# Patient Record
Sex: Female | Born: 1995 | Race: Black or African American | Hispanic: No | Marital: Married
Health system: Midwestern US, Community
[De-identification: ages and names within clinical notes are randomized; demographics above are authoritative.]

## PROBLEM LIST (undated history)

## (undated) DIAGNOSIS — D649 Anemia, unspecified: Secondary | ICD-10-CM

## (undated) DIAGNOSIS — E119 Type 2 diabetes mellitus without complications: Secondary | ICD-10-CM

## (undated) HISTORY — DX: Morbid (severe) obesity due to excess calories: E66.01

## (undated) HISTORY — PX: NO PAST SURGERIES: SHX2092

---

## 2015-11-13 DIAGNOSIS — O365930 Maternal care for other known or suspected poor fetal growth, third trimester, not applicable or unspecified: Secondary | ICD-10-CM

## 2015-11-13 NOTE — Other (Signed)
Patient Demographics  ??   ?? Patient Name HAR Sex DOB Address Phone ??   ?? Taylor Romero, Taylor Romero [phone] Female October 31, 1995 [address]  [place] [address] [phone] (Mobile) ??   ??   ?? CSN: ??   ?? [id number] ??   ??   ?? Admit Date: Admit Time Room Bed ??   ?? Nov 13, 2015 10:04 PM 2007 [15162] 01 [[id number]] ??   ??   ?? Attending Providers  ??   ?? Provider Pager From To ??   ?? Alvester Chou, MD  11/13/15       ADM 7/26 DEL 7/27    Birth History   Birth Information   Birth Length: 45.7 cm   Birth Weight: 2.781 kg   Gestational Age: 96 4/7 weeks   Delivery Method: Vaginal, Spontaneous Delivery   Duration of Labor: 1st: 11h / 2nd: 28m    APGARs   1 Minute: 8   5 Minute: 8

## 2015-11-14 ENCOUNTER — Inpatient Hospital Stay
Admit: 2015-11-14 | Discharge: 2015-11-16 | Disposition: A | Discharge status: Home / Self Care | Payer: MEDICAID | Attending: Obstetrics & Gynecology | Admitting: Obstetrics & Gynecology

## 2015-11-14 LAB — TYPE AND SCREEN
ABO/Rh: O POS
Antibody Screen: NEGATIVE

## 2015-11-14 LAB — HIV 1/2 RAPID SCREEN
HIV RAPID SCR 1/2,RHIV12: NEGATIVE
HIV-1/2 rapid screen: NEGATIVE

## 2015-11-14 LAB — TYPE & SCREEN
ABO/Rh(D): O POS
Antibody screen: NEGATIVE

## 2015-11-14 LAB — METABOLIC PANEL, COMPREHENSIVE
A-G Ratio: 0.5 — ABNORMAL LOW (ref 0.8–1.7)
ALT (SGPT): 13 U/L (ref 13–56)
AST (SGOT): 16 U/L (ref 15–37)
Albumin: 2.3 g/dL — ABNORMAL LOW (ref 3.4–5.0)
Alk. phosphatase: 94 U/L (ref 45–117)
Anion gap: 12 mmol/L (ref 3.0–18)
BUN/Creatinine ratio: 9 — ABNORMAL LOW (ref 12–20)
BUN: 7 MG/DL (ref 7.0–18)
Bilirubin, total: 0.4 MG/DL (ref 0.2–1.0)
CO2: 19 mmol/L — ABNORMAL LOW (ref 21–32)
Calcium: 8.9 MG/DL (ref 8.5–10.1)
Chloride: 102 mmol/L (ref 100–108)
Creatinine: 0.82 MG/DL (ref 0.6–1.3)
GFR est AA: >60 mL/min/{1.73_m2} (ref >60)
GFR est non-AA: >60 mL/min/{1.73_m2} (ref >60)
Globulin: 4.2 g/dL — ABNORMAL HIGH (ref 2.0–4.0)
Glucose: 115 mg/dL — ABNORMAL HIGH (ref 74–99)
Potassium: 4 mmol/L (ref 3.5–5.5)
Protein, total: 6.5 g/dL (ref 6.4–8.2)
Sodium: 133 mmol/L — ABNORMAL LOW (ref 136–145)

## 2015-11-14 LAB — CBC WITH AUTOMATED DIFF
ABS. BASOPHILS: 0 10*3/uL (ref 0.0–0.06)
ABS. EOSINOPHILS: 0 10*3/uL (ref 0.0–0.4)
ABS. LYMPHOCYTES: 0.9 10*3/uL (ref 0.9–3.6)
ABS. MONOCYTES: 0.4 10*3/uL (ref 0.05–1.2)
ABS. NEUTROPHILS: 8.9 10*3/uL — ABNORMAL HIGH (ref 1.8–8.0)
BASOPHILS: 0 % (ref 0–2)
EOSINOPHILS: 0 % (ref 0–5)
HCT: 28.3 % — ABNORMAL LOW (ref 35.0–45.0)
HGB: 8.9 g/dL — ABNORMAL LOW (ref 12.0–16.0)
LYMPHOCYTES: 9 % — ABNORMAL LOW (ref 21–52)
MCH: 19.9 PG — ABNORMAL LOW (ref 24.0–34.0)
MCHC: 31.4 g/dL (ref 31.0–37.0)
MCV: 63.2 FL — ABNORMAL LOW (ref 74.0–97.0)
MONOCYTES: 4 % (ref 3–10)
NEUTROPHILS: 87 % — ABNORMAL HIGH (ref 40–73)
PLATELET: 244 10*3/uL (ref 135–420)
RBC: 4.48 M/uL (ref 4.20–5.30)
RDW: 19.1 % — ABNORMAL HIGH (ref 11.6–14.5)
WBC: 10.2 10*3/uL (ref 4.6–13.2)

## 2015-11-14 LAB — HEMOGLOBIN A1C WITH EAG
Est. average glucose: 146 mg/dL
Hemoglobin A1c: 6.7 % — ABNORMAL HIGH (ref 4.5–5.6)

## 2015-11-14 LAB — POC URINE MACROSCOPIC
Bilirubin (POC): NEGATIVE
Blood (POC): NEGATIVE
Glucose, urine (POC): NEGATIVE mg/dL
Leukocyte esterase (POC): NEGATIVE
Nitrite (POC): NEGATIVE
Protein (POC): 30 mg/dL — AB
Spec. gravity (POC): 1.025 — ABNORMAL HIGH (ref 1.001–1.023)
Urobilinogen (POC): 0.2 EU/dL (ref 0.2–1.0)
pH, urine  (POC): 7 (ref 5.0–9.0)

## 2015-11-14 LAB — GLUCOSE, POC: Glucose (POC): 144 mg/dL — ABNORMAL HIGH (ref 70–110)

## 2015-11-14 LAB — HEP B SURFACE AG
Hep B surface Ag Interp.: NEGATIVE
Hepatitis B surface Ag: <0.1 Index (ref <1.00)

## 2015-11-14 MED ORDER — LACTATED RINGERS BOLUS IV
INTRAVENOUS | Status: DC | PRN
Start: 2015-11-14 — End: 2015-11-14

## 2015-11-14 MED ORDER — ACETAMINOPHEN 325 MG TABLET
325 mg | ORAL | Status: DC | PRN
Start: 2015-11-14 — End: 2015-11-17

## 2015-11-14 MED ORDER — SODIUM CHLORIDE 0.9 % IJ SYRG
INTRAMUSCULAR | Status: DC | PRN
Start: 2015-11-14 — End: 2015-11-14

## 2015-11-14 MED ORDER — METHYLERGONOVINE MALEATE 0.2 MG/ML IJ SOLN
0.2 mg/mL (1 mL) | INTRAMUSCULAR | Status: DC | PRN
Start: 2015-11-14 — End: 2015-11-14

## 2015-11-14 MED ORDER — LACTATED RINGERS IV
INTRAVENOUS | Status: DC
Start: 2015-11-14 — End: 2015-11-14
  Administered 2015-11-14 (×2): via INTRAVENOUS

## 2015-11-14 MED ORDER — PENICILLIN G POT 2.5 MILLION UNITS IN 50 ML 0.9% NACL
2.5 MU | INTRAVENOUS | Status: DC
Start: 2015-11-14 — End: 2015-11-14
  Administered 2015-11-14: 11:00:00 via INTRAVENOUS

## 2015-11-14 MED ORDER — FENTANYL-ROPIVACAINE IN NS (PF) 2 MCG/ML-0.1 % EPIDURAL
EPIDURAL | Status: AC
Start: 2015-11-14 — End: 2015-11-14
  Administered 2015-11-14: 10:00:00 via EPIDURAL

## 2015-11-14 MED ORDER — IBUPROFEN 400 MG TAB
400 mg | Freq: Three times a day (TID) | ORAL | Status: DC | PRN
Start: 2015-11-14 — End: 2015-11-17
  Administered 2015-11-15: 22:00:00 via ORAL

## 2015-11-14 MED ORDER — BUTORPHANOL TARTRATE 2 MG/ML IJ SOLN
2 mg/mL | INTRAMUSCULAR | Status: DC | PRN
Start: 2015-11-14 — End: 2015-11-14
  Administered 2015-11-14: 06:00:00 via INTRAVENOUS

## 2015-11-14 MED ORDER — HYDROMORPHONE (PF) 1 MG/ML IJ SOLN
1 mg/mL | INTRAMUSCULAR | Status: DC | PRN
Start: 2015-11-14 — End: 2015-11-14

## 2015-11-14 MED ORDER — LACTATED RINGERS BOLUS IV
Freq: Once | INTRAVENOUS | Status: DC
Start: 2015-11-14 — End: 2015-11-14

## 2015-11-14 MED ORDER — FENTANYL CITRATE (PF) 50 MCG/ML IJ SOLN
50 mcg/mL | INTRAMUSCULAR | Status: AC
Start: 2015-11-14 — End: 2015-11-14

## 2015-11-14 MED ORDER — FENTANYL CITRATE (PF) 50 MCG/ML IJ SOLN
50 mcg/mL | INTRAMUSCULAR | Status: DC | PRN
Start: 2015-11-14 — End: 2015-11-15
  Administered 2015-11-14: 09:00:00 via EPIDURAL

## 2015-11-14 MED ORDER — PENICILLIN G POTASSIUM 5,000,000 UNIT IJ SOLR
5 million unit | Freq: Once | INTRAMUSCULAR | Status: AC
Start: 2015-11-14 — End: 2015-11-14
  Administered 2015-11-14: 07:00:00 via INTRAVENOUS

## 2015-11-14 MED ORDER — OXYCODONE-ACETAMINOPHEN 5 MG-325 MG TAB
5-325 mg | ORAL | Status: DC | PRN
Start: 2015-11-14 — End: 2015-11-17
  Administered 2015-11-15: 22:00:00 via ORAL

## 2015-11-14 MED ORDER — OXYTOCIN 20 UNITS/1000 ML IN LACTATED RINGERS IV
20 unit/1,000 mL | Freq: Once | INTRAVENOUS | Status: AC
Start: 2015-11-14 — End: 2015-11-14
  Administered 2015-11-14: 13:00:00 via INTRAVENOUS

## 2015-11-14 MED ORDER — SODIUM CHLORIDE 0.9 % IJ SYRG
Freq: Three times a day (TID) | INTRAMUSCULAR | Status: DC
Start: 2015-11-14 — End: 2015-11-14

## 2015-11-14 MED ORDER — NALBUPHINE 10 MG/ML INJECTION
10 mg/mL | INTRAMUSCULAR | Status: DC | PRN
Start: 2015-11-14 — End: 2015-11-14

## 2015-11-14 MED ORDER — FENTANYL-ROPIVACAINE IN NS (PF) 2 MCG/ML-0.1 % EPIDURAL
EPIDURAL | Status: DC
Start: 2015-11-14 — End: 2015-11-14
  Administered 2015-11-14 (×3): via EPIDURAL

## 2015-11-14 MED ORDER — LIDOCAINE HCL 1 % (10 MG/ML) IJ SOLN
10 mg/mL (1 %) | INTRAMUSCULAR | Status: AC
Start: 2015-11-14 — End: 2015-11-14

## 2015-11-14 MED ORDER — FENTANYL CITRATE (PF) 50 MCG/ML IJ SOLN
50 mcg/mL | Freq: Once | INTRAMUSCULAR | Status: DC
Start: 2015-11-14 — End: 2015-11-14

## 2015-11-14 MED ORDER — MINERAL OIL ORAL
ORAL | Status: DC | PRN
Start: 2015-11-14 — End: 2015-11-14

## 2015-11-14 MED ORDER — OXYTOCIN 20 UNITS/1000 ML IN LACTATED RINGERS IV
20 unit/1,000 mL | INTRAVENOUS | Status: DC
Start: 2015-11-14 — End: 2015-11-14
  Administered 2015-11-14: 14:00:00 via INTRAVENOUS

## 2015-11-14 MED ORDER — TERBUTALINE 1 MG/ML SUB-Q
1 mg/mL | SUBCUTANEOUS | Status: DC | PRN
Start: 2015-11-14 — End: 2015-11-14

## 2015-11-14 MED ORDER — LIDOCAINE (PF) 10 MG/ML (1 %) IJ SOLN
10 mg/mL (1 %) | INTRAMUSCULAR | Status: DC | PRN
Start: 2015-11-14 — End: 2015-11-14

## 2015-11-14 MED FILL — BD POSIFLUSH NORMAL SALINE 0.9 % INJECTION SYRINGE: INTRAMUSCULAR | Qty: 10

## 2015-11-14 MED FILL — PENICILLIN G POT 2.5 MILLION UNITS IN 50 ML 0.9% NACL: 2.5 MU | INTRAVENOUS | Qty: 50

## 2015-11-14 MED FILL — OXYTOCIN 20 UNITS/1000 ML IN LACTATED RINGERS IV: 20 unit/1,000 mL | INTRAVENOUS | Qty: 1000

## 2015-11-14 MED FILL — LACTATED RINGERS IV: INTRAVENOUS | Qty: 1000

## 2015-11-14 MED FILL — FENTANYL CITRATE (PF) 50 MCG/ML IJ SOLN: 50 mcg/mL | INTRAMUSCULAR | Qty: 2

## 2015-11-14 MED FILL — PFIZERPEN-G 5 MILLION UNIT SOLUTION FOR INJECTION: 5 million unit | INTRAMUSCULAR | Qty: 5000000

## 2015-11-14 MED FILL — FENTANYL-ROPIVACAINE IN NS (PF) 2 MCG/ML-0.1 % EPIDURAL: EPIDURAL | Qty: 100

## 2015-11-14 MED FILL — MINERAL OIL ORAL: ORAL | Qty: 30

## 2015-11-14 MED FILL — LIDOCAINE HCL 1 % (10 MG/ML) IJ SOLN: 10 mg/mL (1 %) | INTRAMUSCULAR | Qty: 20

## 2015-11-14 MED FILL — BUTORPHANOL TARTRATE 2 MG/ML IJ SOLN: 2 mg/mL | INTRAMUSCULAR | Qty: 1

## 2015-11-14 NOTE — Op Note (Signed)
Patient: Taylor Romero                   Delivery Summary    Circumcision:   NA-female    This patient has no babies on file.    Cord pH:  none    Episiotomy:   Laceration(s):     Estimated Blood Loss (ml):     Labor Events  Method:      Augmentation:   Cervical Ripening:        Induction - no    Induction Indication - N/A    Induction Method - N/A    Complications - none    NICU Admit - no    HTN Disorders - none    Diabetes - N/A    Operative Vaginal Delivery - none    Additional Delivery Comments - No prenatal care

## 2015-11-14 NOTE — Anesthesia Procedure Notes (Signed)
Epidural Block    Start time: 11/14/2015 5:05 AM  End time: 11/14/2015 5:26 AM  Performed by: Krystal Clark  Authorized by: Krystal Clark     Pre-Procedure  Indication: labor epidural    Preanesthetic Checklist: patient identified, risks and benefits discussed, anesthesia consent, site marked, patient being monitored, timeout performed and anesthesia consent    Timeout Time: 05:05        Epidural:   Patient position:  Seated  Prep region:  Lumbar  Prep: Chlorhexidine    Location:  L3-4    Needle and Epidural Catheter:   Needle Type:  Tuohy  Needle Gauge:  17 G  Injection Technique:  Loss of resistance using air  Attempts:  1  Catheter Size:  19 G  Catheter at Skin Depth (cm):  12  Depth in Epidural Space (cm):  9  Events: no blood with aspiration, no cerebrospinal fluid with aspiration, no paresthesia and negative aspiration test    Test Dose:  Lidocaine 1.5% w/ epi and negative    Assessment:   Catheter Secured:  Tegaderm and tape  Insertion:  Uncomplicated  Patient tolerance:  Patient tolerated the procedure well with no immediate complications

## 2015-11-14 NOTE — Progress Notes (Signed)
Bedside and Verbal shift change report given to A. Habura RN (oncoming nurse) by A. Atianna Haidar RN (offgoing nurse). Report included the following information SBAR, Kardex and Recent Results.

## 2015-11-14 NOTE — H&P (Signed)
Obstetrical History and Physical    Subjective:     Date of Admission: 11/14/2015    Patient is a 20 y.o. G1 P0000  female admitted at approximately 98 WEGA with no routine prenatal care other than "a few [place] at [facility]".  Pt states no VB or LOF and states she was never informed of any abnormalities regarding her [place].    For Obstetric history, see prenantal.    For Review of Systems, see prenatal    Past Medical History:   Diagnosis Date   ??? Psychiatric problem     depression      No past surgical history on file.   Prior to Admission medications    Medication Sig Start Date End Date Taking? Authorizing Provider   PNV NO.95/FERROUS FUM/FOLIC AC (PRENATAL PO) Take  by mouth.   Yes Historical Provider     No Known Allergies   Social History   Substance Use Topics   ??? Smoking status: Never Smoker   ??? Smokeless tobacco: Never Used   ??? Alcohol use No      No family history on file.       Objective:     Blood pressure 149/74, pulse (!) 116, temperature 98.1 ??F (36.7 ??C), resp. rate 20, height  (1.6 m), weight 122.5 kg (270 lb), SpO2 100 %, currently breastfeeding. Temp (24hrs), Avg:98.2 ??F (36.8 ??C), Min:98.1 ??F (36.7 ??C), Max:98.3 ??F (36.8 ??C)           07/25 1901 - 07/27 0700  In: -   Out: 130 [Urine:130]    HEENT: No pallor, no jaundice, Thyroid and throat normal  RESPIRATORY: Clear to A & P  CVS: pulse reg, HS normal  ABDOMEN: Gravid. Vertex. EFW=7lb +-1lb. No abnormal tenderness.   Pelvic: Cervix 10,   Effaced: 100%  Station:  +1  Data Review:   Recent Results (from the past 24 hour(s))   POC URINE MACROSCOPIC    Collection Time: 11/13/15 10:27 PM   Result Value Ref Range    Color YELLOW      Appearance CLEAR      Spec. gravity (POC) 1.025 (H) 1.001 - 1.023      pH, urine  (POC) 7.0 5.0 - 9.0      Protein (POC) 30 (A) NEG mg/dL    Glucose, urine (POC) NEGATIVE  NEG mg/dL    Ketones (POC) TRACE (A) NEG mg/dL    Bilirubin (POC) NEGATIVE  NEG      Blood (POC) NEGATIVE  NEG       Urobilinogen (POC) 0.2 0.2 - 1.0 EU/dL    Nitrite (POC) NEGATIVE  NEG      Leukocyte esterase (POC) NEGATIVE  NEG      Performed by Liborio Nixon    CBC WITH AUTOMATED DIFF    Collection Time: 11/14/15  2:14 AM   Result Value Ref Range    WBC 10.2 4.6 - 13.2 K/uL    RBC 4.48 4.20 - 5.30 M/uL    HGB 8.9 (L) 12.0 - 16.0 g/dL    HCT 42.7 (L) 06.2 - 45.0 %    MCV 63.2 (L) 74.0 - 97.0 FL    MCH 19.9 (L) 24.0 - 34.0 PG    MCHC 31.4 31.0 - 37.0 g/dL    RDW 37.6 (H) 28.3 - 14.5 %    PLATELET 244 135 - 420 K/uL    NEUTROPHILS 87 (H) 40 - 73 %    LYMPHOCYTES 9 (L) 21 -  52 %    MONOCYTES 4 3 - 10 %    EOSINOPHILS 0 0 - 5 %    BASOPHILS 0 0 - 2 %    ABS. NEUTROPHILS 8.9 (H) 1.8 - 8.0 K/UL    ABS. LYMPHOCYTES 0.9 0.9 - 3.6 K/UL    ABS. MONOCYTES 0.4 0.05 - 1.2 K/UL    ABS. EOSINOPHILS 0.0 0.0 - 0.4 K/UL    ABS. BASOPHILS 0.0 0.0 - 0.06 K/UL    DF AUTOMATED     TYPE & SCREEN    Collection Time: 11/14/15  2:14 AM   Result Value Ref Range    Crossmatch Expiration 11/17/2015     ABO/Rh(D) [place] POSITIVE     Antibody screen NEG    HIV 1/2 RAPID SCREEN    Collection Time: 11/14/15  2:14 AM   Result Value Ref Range    HIV-1/2 rapid screen NEGATIVE      GLUCOSE, POC    Collection Time: 11/14/15  8:20 AM   Result Value Ref Range    Glucose (POC) 144 (H) 70 - 110 mg/dL     Monitor:  Reactivity:present Variability:present Baseline:within normal limits    Assessment:     Active Problems:    IUP (intrauterine pregnancy), incidental (11/14/2015)        Plan:       Check labs:  CBC  Check  Prenatal:    Disposition:     Type of admit:In-Patient    I saw and examined patient.    Signed By: Alvester Chou, MD                         November 14, 2015

## 2015-11-14 NOTE — Progress Notes (Signed)
Patient was visited by Spiritual Care volunteer Judi Allison. Volunteer conducted a Spiritual Care Screening and reported no needs to this chaplain. Baby Blessing Card and Spiritual Care literature were provided. Chaplains will continue to follow and will provide pastoral care as needed or requested.    Chaplain T. Wes Moore  Chaplain Intern  [phone] - Office

## 2015-11-14 NOTE — Lactation Note (Addendum)
Infant had to be supplemented for low blood sugar. Per mom, had a hard time getting infant to latch. Will page for feeds. Breastfeeding basics and supply and demand discussed.    1315 Infant latched and nursing well with nipple shield at 1325    1745 Set up with pump as infant now moved to NICU for monitoring of blood sugars.

## 2015-11-14 NOTE — Anesthesia Procedure Notes (Signed)
Epidural Block    Start time: 11/14/2015 5:05 AM  End time: 11/14/2015 5:26 AM  Performed by: Lorisa Scheid  Authorized by: Talea Manges     Pre-Procedure  Indication: labor epidural    Preanesthetic Checklist: patient identified, risks and benefits discussed, anesthesia consent, site marked, patient being monitored, timeout performed and anesthesia consent    Timeout Time: 05:05        Epidural:   Patient position:  Seated  Prep region:  Lumbar  Prep: Chlorhexidine    Location:  L3-4    Needle and Epidural Catheter:   Needle Type:  Tuohy  Needle Gauge:  17 G  Injection Technique:  Loss of resistance using air  Attempts:  1  Catheter Size:  19 G  Catheter at Skin Depth (cm):  12  Depth in Epidural Space (cm):  9  Events: no blood with aspiration, no cerebrospinal fluid with aspiration, no paresthesia and negative aspiration test    Test Dose:  Lidocaine 1.5% w/ epi and negative    Assessment:   Catheter Secured:  Tegaderm and tape  Insertion:  Uncomplicated  Patient tolerance:  Patient tolerated the procedure well with no immediate complications

## 2015-11-14 NOTE — Progress Notes (Addendum)
0720: Bedside and Verbal shift change report given to E Sauder, RN (oncoming nurse) by Carmin Muskrat, RN (offgoing nurse). Report included the following information SBAR, Kardex, Intake/Output, MAR and Recent Results. Care assumed. Patient in semi fowlers.  Has some "vaginal soreness" but states the epidural has helped with her back pain and contraction pain    0746: amion page to dr. Sindy Guadeloupe. Anterior lip with bulging bag. Patient feeling some pressure    0759: informed MD of BPs.while on left side. Will order PIH labs    0820: bedside fingerstick blood sugar 144. MD aware    0845: SVD of viable female infant    49: placenta    1030: OOB to void with assistance. Epidural pulled. Able to ambulated and void.  Pads and gown changed    1100: oob to void. Able to void and abmbulate without assistance    1115: TRANSFER - OUT REPORT:    Verbal report given to A Cory(name) on Andres Labrum  being transferred to pp rm 251 (unit) for routine progression of care       Report consisted of patient???s Situation, Background, Assessment and   Recommendations(SBAR).     Information from the following report(s) SBAR, Kardex, Intake/Output, MAR and Recent Results was reviewed with the receiving nurse.    Lines:   Peripheral IV 11/14/15 Left Hand (Active)        Opportunity for questions and clarification was provided.      Patient transported with:   Registered Nurse        1240: dr. Sindy Guadeloupe informed of A1C result

## 2015-11-14 NOTE — Progress Notes (Addendum)
2104 pt arrived here on unit via wheelchair with c/o ctxs all day, pt states she does not have a dr, pt states she had an ultrasound through [facility]  2230 cervix 2cm  0000 cervix now 3cm, pt c/o leaking, green mucus noted when checking  0025 spoke with Dr Sindy Guadeloupe via phone about pt status, cervix 3cm bag of water felt, thick green mucus discharge noted, nitrazine neg, orders to discharge to home  0045 discussed poc with pt, pt states she is feeling more uncomfortable now and would like to stay longer  0105 cervix 4cm now, green mucus discharge noted  0120 spoke with Dr Sindy Guadeloupe via phone about pt status, cervix, green discharge, fetal strip, orders to admit pt  0340 spoke with Dr Sindy Guadeloupe via phone about gbs unknown, orders to start antibiotics  0535 spoke with Dr Kerry Hough for pt epidural  0645 Dr Sindy Guadeloupe called in via phone, update given on pt status, strip being viewed by MD from home,

## 2015-11-14 NOTE — Op Note (Signed)
Patient: Taegen Sparkman                   Delivery Summary    Circumcision:   NA-female    This patient has no babies on file.    Cord pH:  none    Episiotomy:   Laceration(s):     Estimated Blood Loss (ml):     Labor Events  Method:      Augmentation:   Cervical Ripening:        Induction - no    Induction Indication - N/A    Induction Method - N/A    Complications - none    NICU Admit - no    HTN Disorders - none    Diabetes - N/A    Operative Vaginal Delivery - none    Additional Delivery Comments - No prenatal care

## 2015-11-14 NOTE — Progress Notes (Addendum)
0202:  Pt admitted.  Pt ambulated to Room 7 with husband at bedside.      0214:  Pt c/o pain 06/10 with contractions.    0225:  PRN stadol adminsitered    0421:  Pt request epidural.  Bolus started    0446:  Anesthesia paged    0505: Dr. Kerry Hough @ bedside discussing epidural. Risk and benefits discussed with pt.Pt wished to proceed. Confirmation of signed consent form. Pt positioned at side of bed for procedure. ??Time out performed.    0518  Catheter placed.   ??  0518: Test dose administered. ??    0519:  Loading dose administered. Fentanyl vial given to Kerry Hough to be administered through epidural.    0522:  Pt lying back in bed. TOCO and [place] adjusted. ??PCA pump started.

## 2015-11-14 NOTE — Anesthesia Pre-Procedure Evaluation (Signed)
Anesthetic History   No history of anesthetic complications            Review of Systems / Medical History  Patient summary reviewed, nursing notes reviewed and pertinent labs reviewed    Pulmonary  Within defined limits                 Neuro/Psych   Within defined limits           Cardiovascular  Within defined limits                     GI/Hepatic/Renal  Within defined limits              Endo/Other        Morbid obesity     Other Findings              Physical Exam    Airway  Mallampati: II  TM Distance: 4 - 6 cm  Neck ROM: normal range of motion   Mouth opening: Normal     Cardiovascular               Dental  No notable dental hx       Pulmonary                 Abdominal         Other Findings            Anesthetic Plan    ASA: 3  Anesthesia type: epidural            Anesthetic plan and risks discussed with: Patient and Family      R/B discussed including ha backache bleeding infection nerve damage paralysis.

## 2015-11-15 LAB — RPR
RPR: NONREACTIVE
RPR: NONREACTIVE

## 2015-11-15 LAB — HEMATOCRIT: HCT: 25.7 % — ABNORMAL LOW (ref 35.0–45.0)

## 2015-11-15 LAB — HEMOGLOBIN: HGB: 7.9 g/dL — ABNORMAL LOW (ref 12.0–16.0)

## 2015-11-15 LAB — RUBELLA AB, IGG: Rubella IgG, QL: IMMUNE

## 2015-11-15 MED ORDER — BENZOCAINE-MENTHOL 20 %-0.5 % TOPICAL AEROSOL
CUTANEOUS | Status: DC | PRN
Start: 2015-11-15 — End: 2015-11-17

## 2015-11-15 MED ORDER — ZOLPIDEM 5 MG TAB
5 mg | Freq: Every evening | ORAL | Status: DC | PRN
Start: 2015-11-15 — End: 2015-11-17

## 2015-11-15 MED ORDER — GLYCERIN-WITCH HAZEL 12.5 %-50 % TOPICAL PADS
CUTANEOUS | Status: DC | PRN
Start: 2015-11-15 — End: 2015-11-17

## 2015-11-15 MED ORDER — MEASLES,MUMPS&RUBELLA VACCIN(PF) 1,000-12,500 TCID50/0.5 ML SUB-Q SUSP
1000-12500 TCID50/0.5 mL | SUBCUTANEOUS | Status: DC | PRN
Start: 2015-11-15 — End: 2015-11-16

## 2015-11-15 MED ORDER — PROMETHAZINE 25 MG/ML INJECTION
25 mg/mL | INTRAMUSCULAR | Status: DC | PRN
Start: 2015-11-15 — End: 2015-11-17

## 2015-11-15 MED ORDER — RHO D IMMUNE GLOBULIN 300 MCG IM SYRG
1500 unit (300 mcg) | INTRAMUSCULAR | Status: DC | PRN
Start: 2015-11-15 — End: 2015-11-16

## 2015-11-15 MED ORDER — SENNOSIDES-DOCUSATE SODIUM 8.6 MG-50 MG TAB
Freq: Two times a day (BID) | ORAL | Status: DC | PRN
Start: 2015-11-15 — End: 2015-11-17

## 2015-11-15 MED ORDER — MODIFIED LANOLIN CREAM
CUTANEOUS | Status: DC | PRN
Start: 2015-11-15 — End: 2015-11-17

## 2015-11-15 MED FILL — OXYCODONE-ACETAMINOPHEN 5 MG-325 MG TAB: 5-325 mg | ORAL | Qty: 2

## 2015-11-15 MED FILL — DERMOPLAST (WITH MENTHOL) 20 %-0.5 % TOPICAL AEROSOL: CUTANEOUS | Qty: 56

## 2015-11-15 MED FILL — MODIFIED LANOLIN CREAM: CUTANEOUS | Qty: 7

## 2015-11-15 MED FILL — GLYCERIN-WITCH HAZEL 12.5 %-50 % TOPICAL PADS: CUTANEOUS | Qty: 1

## 2015-11-15 MED FILL — IBUPROFEN 400 MG TAB: 400 mg | ORAL | Qty: 2

## 2015-11-15 NOTE — Anesthesia Post-Procedure Evaluation (Signed)
11/15/2015  1:21 PM    Laboring Epidural Follow-up Note     Referring physician: Alvester Chou, MD   Patient status post vaginal delivery with labor epidural    Visit Vitals   ??? BP 114/53 (BP 1 Location: Right arm, BP Patient Position: At rest)   ??? Pulse 100   ??? Temp 36.8 ??C (98.2 ??F)   ??? Resp 14   ??? Ht 5\' 3"  (1.6 m)   ??? Wt 122.5 kg (270 lb)   ??? SpO2 98%   ??? Breastfeeding Yes   ??? BMI 47.83 kg/m2       Epidural removed by L&D staff  No sedation, pruritis noted.  Adequate analgesia.  No obvious anesthesia complications          Etter Sjogren, CRNA

## 2015-11-15 NOTE — Progress Notes (Signed)
OB/GYN Progress Note    PPD # 1  Assessment:     S/p SVD  NPNC  Infant in NICU secondary to SGA and hypoglycemia  Doing well    Plan:   Routine care  Home tomorrow  Continue current care.    Subjective:     The patient denies complaints. Pain is well controlled with current medications.  Urinating without difficulty. The patient is ambulating well. The patient is tolerating a normal diet.  decreased Lochia  Pt. is breastfeeding.    Objective:      Visit Vitals   ??? BP 114/53 (BP 1 Location: Right arm, BP Patient Position: At rest)   ??? Pulse 100   ??? Temp 98.2 ??F (36.8 ??C)   ??? Resp 14   ??? Ht '5\' 3"'  (1.6 m)   ??? Wt 122.5 kg (270 lb)   ??? SpO2 98%   ??? Breastfeeding Yes   ??? BMI 47.83 kg/m2       Intake/Output Summary (Last 24 hours) at 11/15/15 0942  Last data filed at 11/14/15 1300   Gross per 24 hour   Intake                0 ml   Output              880 ml   Net             -880 ml        General:    no distress   CVS:   RRR   Lungs: Clear to auscultation   Abdomen:   Fundus firm   Extremities:  No evidence of DVT seen on physical exam.. no edema          Labs:    Recent Results (from the past 24 hour(s))   METABOLIC PANEL, COMPREHENSIVE    Collection Time: 11/14/15  9:53 AM   Result Value Ref Range    Sodium 133 (L) 136 - 145 mmol/L    Potassium 4.0 3.5 - 5.5 mmol/L    Chloride 102 100 - 108 mmol/L    CO2 19 (L) 21 - 32 mmol/L    Anion gap 12 3.0 - 18 mmol/L    Glucose 115 (H) 74 - 99 mg/dL    BUN 7 7.0 - 18 MG/DL    Creatinine 0.82 0.6 - 1.3 MG/DL    BUN/Creatinine ratio 9 (L) 12 - 20      GFR est AA >60 >60 ml/min/1.62m    GFR est non-AA >60 >60 ml/min/1.788m   Calcium 8.9 8.5 - 10.1 MG/DL    Bilirubin, total 0.4 0.2 - 1.0 MG/DL    ALT (SGPT) 13 13 - 56 U/L    AST (SGOT) 16 15 - 37 U/L    Alk. phosphatase 94 45 - 117 U/L    Protein, total 6.5 6.4 - 8.2 g/dL    Albumin 2.3 (L) 3.4 - 5.0 g/dL    Globulin 4.2 (H) 2.0 - 4.0 g/dL    A-G Ratio 0.5 (L) 0.8 - 1.7     RPR    Collection Time: 11/14/15  9:53 AM    Result Value Ref Range    RPR NONREACTIVE NR     HEMATOCRIT    Collection Time: 11/15/15  5:32 AM   Result Value Ref Range    HCT 25.7 (L) 35.0 - 45.0 %   HEMOGLOBIN    Collection Time: 11/15/15  5:32 AM   Result Value  Ref Range    HGB 7.9 (L) 12.0 - 16.0 g/dL            Taylor Monday, MD, MD 11/15/2015 9:42 AM

## 2015-11-15 NOTE — Other (Signed)
Bedside and Verbal shift change report given to N. Williams (oncoming nurse) by A. Habura (offgoing nurse). Report included the following information SBAR, Intake/Output, MAR and Recent Results.

## 2015-11-16 MED ORDER — IBUPROFEN 800 MG TAB
800 mg | ORAL_TABLET | Freq: Three times a day (TID) | ORAL | 1 refills | Status: AC | PRN
Start: 2015-11-16 — End: ?

## 2015-11-16 NOTE — Other (Signed)
Bedside and Verbal shift change report given to A.Cory, RN (oncoming nurse) by M.Marchant, RN (offgoing nurse). Report included the following information SBAR, Kardex and MAR.

## 2015-11-16 NOTE — Progress Notes (Signed)
Discharge instructions reviewed with patient and family, and they have no questions or concerns. Post discharge warning signs pamphlet, postpartum discharge instruction sheet, and newborn discharge instruction sheet given.  Explained that the NICU would do discharge teaching with them.  Patient discharged to rooming-in status as infant remains in the NICU.  The rooming-in process explained.

## 2015-11-16 NOTE — Lactation Note (Signed)
Mom continues double pumping every 2-3 hours.  Got 2oz X 2 last session.

## 2015-11-16 NOTE — Lactation Note (Signed)
Mom continues double pumping every 3 hours--easily gets 2+ oz /breast.

## 2015-11-16 NOTE — Progress Notes (Signed)
OB/GYN Progress Note    PPD # 2  Assessment:     S/p SVD  Infant with meconium aspiration, still in NICU  Doing well    Plan:   Will d/c today to room in  Discharge home with standard precautions and return to clinic in 4-6 weeks.    Subjective:     The patient denies complaints. Pain is well controlled with current medications.  Urinating without difficulty. The patient is ambulating well. The patient is tolerating a normal diet.  decreased Lochia  Pt. is breastfeeding.    Objective:      Visit Vitals   ??? BP 113/60 (BP 1 Location: Right arm, BP Patient Position: At rest)   ??? Pulse 98   ??? Temp 97.9 ??F (36.6 ??C)   ??? Resp 18   ??? Ht 5\' 3"  (1.6 m)   ??? Wt 122.5 kg (270 lb)   ??? SpO2 99%   ??? Breastfeeding Yes   ??? BMI 47.83 kg/m2     No intake or output data in the 24 hours ending 11/16/15 1130     General:    no distress   CVS:   RRR   Lungs: Clear to auscultation   Abdomen:   Fundus firm   Extremities:  No evidence of DVT seen on physical exam.. no edema          Labs:  No results found for this or any previous visit (from the past 24 hour(s)).         Caro Laroche, MD, MD 11/16/2015 11:30 AM

## 2015-11-16 NOTE — Lactation Note (Signed)
Mom does not have a pump-to get a medela PNS from relative.  Given double manual parts as backup. Has not put infant to breast yet.  Encouraged to work with LC in am.  Infant's NG tube removed today.

## 2018-04-20 NOTE — L&D Delivery Note (Addendum)
OB/GYN Faculty Practice Delivery Note  Linda Torres is a 23 y.o. G3P3003 s/p SVD at [redacted]w[redacted]d. She was admitted for IOL for uncontrolled A1gDM.Marland Kitchen   ROM: 1h 79m with thick particulate meconium fluid GBS Status:  Positive/-- (12/14 1405) s/p IV penG x 4 doses Maximum Maternal Temperature: 98.4   Labor Progress: . Patient arrived at 0 cm dilation and was induced with cytotec which caused uterine tachysystole, so she received terbutaline and 3-4 hours later was started on pitocin. Which then she progressed quickly to complete.   Delivery Date/Time: 04/08/2019 at 2054 Delivery: Called to room and patient was complete and pushing. Head delivered in LOA w/ L hand compound presentation position. No nuchal cord present. Shoulder and body delivered in usual fashion. Infant with spontaneous cry, placed on mother's abdomen, dried and stimulated. Cord clamped x 2 after 1-minute delay, and cut by [name]. Cord blood drawn. Placenta delivered spontaneously with gentle cord traction. Fundus firm with massage and Pitocin. Labia, perineum, vagina, and cervix inspected with no lacerations.   Placenta: spontaneous, intact, 3 vessel cord Complications: thick mec Lacerations: none EBL: 100cc Analgesia: epidural   Infant: APGAR (1 MIN): 8   APGAR (5 MINS): 9    Weight: 2336P  Demetrius Revel, MD PGY-3  Midwife attestation: I was gloved and present for delivery in its entirety and I agree with the above resident's note.  Lajean Manes, CNM 1:58 AM

## 2019-01-02 ENCOUNTER — Ambulatory Visit (INDEPENDENT_AMBULATORY_CARE_PROVIDER_SITE_OTHER): Payer: Medicaid Other | Admitting: Lactation Services

## 2019-01-02 ENCOUNTER — Other Ambulatory Visit: Payer: Self-pay

## 2019-01-02 ENCOUNTER — Other Ambulatory Visit (HOSPITAL_COMMUNITY)
Admission: RE | Admit: 2019-01-02 | Discharge: 2019-01-02 | Disposition: A | Discharge status: Home / Self Care | Payer: Medicaid Other | Source: Ambulatory Visit | Attending: Family Medicine | Admitting: Family Medicine

## 2019-01-02 DIAGNOSIS — Z3480 Encounter for supervision of other normal pregnancy, unspecified trimester: Secondary | ICD-10-CM

## 2019-01-02 DIAGNOSIS — Z3493 Encounter for supervision of normal pregnancy, unspecified, third trimester: Secondary | ICD-10-CM

## 2019-01-02 DIAGNOSIS — Z3490 Encounter for supervision of normal pregnancy, unspecified, unspecified trimester: Secondary | ICD-10-CM | POA: Diagnosis not present

## 2019-01-02 LAB — POCT PREGNANCY, URINE: Preg Test, Ur: POSITIVE — AB

## 2019-01-02 MED ORDER — PRENATAL PLUS 27-1 MG PO TABS: 1.0000 | ORAL_TABLET | Freq: Every day | ORAL | 11 refills | Status: AC

## 2019-01-02 MED ORDER — BLOOD PRESSURE KIT DEVI: 1.0000 | Freq: Once | 0 refills | Status: AC

## 2019-01-02 NOTE — Progress Notes (Signed)
Pt reports she moved from [place] and was waiting on Medicaid to come in for [facility]. Pt has had no prenatal care for this pregnancy.   LMP 07/18/2018 with EDD 04/24/2019. Pt is feeling infant move daily.   Pt reports she has been tired, otherwise been feeling well. Pt has 2 other children with concern of GDM with both infants. Infants were 6 and 8 pounds.    Pt does not have a BP cuff at home. BP ordered through [facility] and pt asked to answer the phone to order.   PN labs ordered. Pt informed to come to next visit fasting.   OTC list of approved medications given.   Pt dealing with housing/financial issues and just moved to the area. She would like to be contacted by Vesta Mixer, LCSW. Referral placed.   Pt to be scheduled for prenatal care and [place] at checkout.

## 2019-01-03 LAB — GC/CHLAMYDIA PROBE AMP (~~LOC~~) NOT AT ARMC
Chlamydia: NEGATIVE
Neisseria Gonorrhea: NEGATIVE

## 2019-01-05 LAB — URINE CULTURE, OB REFLEX

## 2019-01-05 LAB — CULTURE, OB URINE

## 2019-01-06 ENCOUNTER — Encounter (HOSPITAL_COMMUNITY): Payer: Self-pay | Admitting: *Deleted

## 2019-01-10 ENCOUNTER — Encounter (HOSPITAL_COMMUNITY): Payer: Self-pay | Admitting: *Deleted

## 2019-01-10 ENCOUNTER — Other Ambulatory Visit: Payer: Self-pay

## 2019-01-10 ENCOUNTER — Ambulatory Visit (HOSPITAL_COMMUNITY): Payer: Medicaid Other | Admitting: *Deleted

## 2019-01-10 ENCOUNTER — Ambulatory Visit (HOSPITAL_COMMUNITY)
Admission: RE | Admit: 2019-01-10 | Discharge: 2019-01-10 | Disposition: A | Discharge status: Home / Self Care | Payer: Medicaid Other | Source: Ambulatory Visit | Attending: Obstetrics & Gynecology | Admitting: Obstetrics & Gynecology

## 2019-01-10 ENCOUNTER — Other Ambulatory Visit (HOSPITAL_COMMUNITY): Payer: Self-pay | Admitting: *Deleted

## 2019-01-10 VITALS — BP 120/68 | HR 98 | Temp 98.4°F | Ht 65.5 in | Wt 310.0 lb

## 2019-01-10 DIAGNOSIS — Z3A25 25 weeks gestation of pregnancy: Secondary | ICD-10-CM | POA: Diagnosis not present

## 2019-01-10 DIAGNOSIS — Z363 Encounter for antenatal screening for malformations: Secondary | ICD-10-CM | POA: Insufficient documentation

## 2019-01-10 DIAGNOSIS — O0932 Supervision of pregnancy with insufficient antenatal care, second trimester: Secondary | ICD-10-CM | POA: Diagnosis not present

## 2019-01-10 DIAGNOSIS — O99212 Obesity complicating pregnancy, second trimester: Secondary | ICD-10-CM

## 2019-01-10 DIAGNOSIS — Z3480 Encounter for supervision of other normal pregnancy, unspecified trimester: Secondary | ICD-10-CM

## 2019-01-10 DIAGNOSIS — O09892 Supervision of other high risk pregnancies, second trimester: Secondary | ICD-10-CM

## 2019-01-13 LAB — INHERITEST(R) CF/SMA PANEL

## 2019-01-13 LAB — OBSTETRIC PANEL, INCLUDING HIV
Antibody Screen: NEGATIVE
Basophils Absolute: 0.1 10*3/uL (ref 0.0–0.2)
Basos: 1 %
EOS (ABSOLUTE): 0.1 10*3/uL (ref 0.0–0.4)
Eos: 1 %
HIV Screen 4th Generation wRfx: NONREACTIVE
Hematocrit: 33.6 % — ABNORMAL LOW (ref 34.0–46.6)
Hemoglobin: 9.5 g/dL — ABNORMAL LOW (ref 11.1–15.9)
Hepatitis B Surface Ag: NEGATIVE
Immature Grans (Abs): 0 10*3/uL (ref 0.0–0.1)
Immature Granulocytes: 0 %
Lymphocytes Absolute: 1.2 10*3/uL (ref 0.7–3.1)
Lymphs: 16 %
MCH: 19.2 pg — ABNORMAL LOW (ref 26.6–33.0)
MCHC: 28.3 g/dL — ABNORMAL LOW (ref 31.5–35.7)
MCV: 68 fL — ABNORMAL LOW (ref 79–97)
Monocytes Absolute: 0.3 10*3/uL (ref 0.1–0.9)
Monocytes: 4 %
Neutrophils Absolute: 5.9 10*3/uL (ref 1.4–7.0)
Neutrophils: 78 %
Platelets: 291 10*3/uL (ref 150–450)
RBC: 4.95 x10E6/uL (ref 3.77–5.28)
RDW: 21 % — ABNORMAL HIGH (ref 11.7–15.4)
RPR Ser Ql: NONREACTIVE
Rh Factor: POSITIVE
Rubella Antibodies, IGG: 5.74 index (ref >0.99)
WBC: 7.6 10*3/uL (ref 3.4–10.8)

## 2019-01-13 LAB — HEMOGLOBIN A1C
Est. average glucose Bld gHb Est-mCnc: 128 mg/dL
Hgb A1c MFr Bld: 6.1 % — ABNORMAL HIGH (ref 4.8–5.6)

## 2019-01-16 ENCOUNTER — Ambulatory Visit: Payer: Medicaid Other | Admitting: *Deleted

## 2019-01-16 ENCOUNTER — Other Ambulatory Visit: Payer: Self-pay

## 2019-01-16 DIAGNOSIS — Z3490 Encounter for supervision of normal pregnancy, unspecified, unspecified trimester: Secondary | ICD-10-CM | POA: Diagnosis not present

## 2019-01-16 NOTE — Progress Notes (Signed)
Addendum:  2:10 I called [facility] and they did receive the order and were not able to reach her; they had different phone number. They informed me it is ready for pick up and asked if I would call her . I called Sh[facility]ana and notified her it is ready for pick up and gave her the address. I asked her to bring with her to her new ob appt on 10/1 so that we can show her how to operate it and give her guidelines. She voices understanding. Mialani Reicks,RN

## 2019-01-16 NOTE — Progress Notes (Signed)
I connected with  Linda Torres on 01/16/19 at  1:30 PM EDT by telephone and verified that I am speaking with the correct person using two identifiers.   I discussed the limitations, risks, security and privacy concerns of performing an evaluation and management service by telephone and the availability of in person appointments. I also discussed with the patient that there may be a patient responsible charge related to this service. The patient expressed understanding and agreed to proceed.  Explained I am completing her New OB Intake today. We discussed Her EDD and that it is based on  sure LMP . I reviewed her allergies, meds, OB History, Medical /Surgical history, and appropriate screenings. She has already been sent the  [employer] app- and she states she has already downloaded it; but has not used it yet. .  She has already had a blood pressure cuff ordered but she has not received a call or the cuff. I informed her we will call [facility] and look into it.  Explained  then we will have her take her blood pressure weekly and enter into the app. Explained she will have some visits in office and some virtually. She already has Community education officer. Reviewed appointment date/ time with her , our location and to wear mask, no visitors. Explained she will have exam, ob bloodwork if needed; most already done ,2 hr gtt, genetic testing if desired, pap if needed. She has already had  [place] at 19 weeks and has a follow up scheduled . She had + PHQ-9 and did answer yes to thoughts of better off dead but denies plan to end her life. She has appointment with [name] [facility] for 10/1 scheduled.  She voices understanding.   Traxton Kolenda,RN 01/16/2019  1:28 PM

## 2019-01-18 ENCOUNTER — Other Ambulatory Visit: Payer: Self-pay | Admitting: *Deleted

## 2019-01-18 ENCOUNTER — Telehealth: Payer: Self-pay | Admitting: Obstetrics and Gynecology

## 2019-01-18 DIAGNOSIS — Z3490 Encounter for supervision of normal pregnancy, unspecified, unspecified trimester: Secondary | ICD-10-CM

## 2019-01-18 NOTE — Progress Notes (Signed)
I have reviewed this chart and agree with the RN/CMA assessment and management.    K. Meryl Tonya Carlile, M.D. Attending [facility] (Faculty Practice)   

## 2019-01-18 NOTE — Telephone Encounter (Signed)
Attempted to call patient about her appointment on 10/1 @ 8:35. No answer left voicemail instructing patient to wear a face mask for the entire appointment and no visitors are allowed during the visit. Patient instructed not to attend the appointment if she was any symptoms. Symptom list and office number left. Patient instructed to come fasting.

## 2019-01-19 ENCOUNTER — Encounter: Payer: Self-pay | Admitting: Obstetrics and Gynecology

## 2019-01-19 ENCOUNTER — Other Ambulatory Visit: Payer: Self-pay

## 2019-01-19 ENCOUNTER — Ambulatory Visit (INDEPENDENT_AMBULATORY_CARE_PROVIDER_SITE_OTHER): Payer: Medicaid Other | Admitting: Obstetrics and Gynecology

## 2019-01-19 ENCOUNTER — Other Ambulatory Visit: Payer: Medicaid Other

## 2019-01-19 ENCOUNTER — Ambulatory Visit (INDEPENDENT_AMBULATORY_CARE_PROVIDER_SITE_OTHER): Payer: Medicaid Other | Admitting: Clinical

## 2019-01-19 VITALS — BP 115/78 | HR 111 | Wt 314.2 lb

## 2019-01-19 DIAGNOSIS — Z3009 Encounter for other general counseling and advice on contraception: Secondary | ICD-10-CM | POA: Insufficient documentation

## 2019-01-19 DIAGNOSIS — Z3492 Encounter for supervision of normal pregnancy, unspecified, second trimester: Secondary | ICD-10-CM | POA: Diagnosis not present

## 2019-01-19 DIAGNOSIS — Z3A26 26 weeks gestation of pregnancy: Secondary | ICD-10-CM

## 2019-01-19 DIAGNOSIS — O99343 Other mental disorders complicating pregnancy, third trimester: Secondary | ICD-10-CM

## 2019-01-19 DIAGNOSIS — Z23 Encounter for immunization: Secondary | ICD-10-CM | POA: Diagnosis not present

## 2019-01-19 DIAGNOSIS — Z3490 Encounter for supervision of normal pregnancy, unspecified, unspecified trimester: Secondary | ICD-10-CM

## 2019-01-19 DIAGNOSIS — Z3480 Encounter for supervision of other normal pregnancy, unspecified trimester: Secondary | ICD-10-CM

## 2019-01-19 DIAGNOSIS — D649 Anemia, unspecified: Secondary | ICD-10-CM | POA: Insufficient documentation

## 2019-01-19 DIAGNOSIS — D508 Other iron deficiency anemias: Secondary | ICD-10-CM

## 2019-01-19 MED ORDER — FERROUS SULFATE 325 (65 FE) MG PO TABS: 325.0000 mg | ORAL_TABLET | Freq: Two times a day (BID) | ORAL | 1 refills | Status: DC

## 2019-01-19 NOTE — Progress Notes (Signed)
INITIAL PRENATAL VISIT NOTE  Subjective:  Linda Torres is a 23 y.o. G3P2002 at [redacted]w[redacted]d by LMP c/w 25 week [place] being seen today for her initial prenatal visit. This is an unplanned pregnancy. She was using nothing for birth control previously. She has an obstetric history significant for 2 x SVD term. She has a medical history significant for n/a.  Patient reports no complaints.  Contractions: Not present. Vag. Bleeding: None.  Movement: Present. Denies leaking of fluid.    Past Medical History:  Diagnosis Date  . Morbid obesity ([place])     Past Surgical History:  Procedure Laterality Date  . NO PAST SURGERIES      OB History  Gravida Para Term Preterm AB Living  3 2 2     2   SAB TAB Ectopic Multiple Live Births          2    # Outcome Date GA Lbr Len/2nd Weight Sex Delivery Anes PTL Lv  3 Current           2 Term 01/25/18 [redacted]w[redacted]d  8 lb (3.629 kg) F Vag-Spont None  LIV     Birth Comments: no complications  1 Term 09/60/45 [redacted]w[redacted]d  6 lb 2 oz (2.778 kg) F Vag-Spont EPI  LIV     Birth Comments: no complications    Social History   Socioeconomic History  . Marital status: Married    Spouse name: Not on file  . Number of children: Not on file  . Years of education: Not on file  . Highest education level: Not on file  Occupational History  . Not on file  Social Needs  . Financial resource strain: Not on file  . Food insecurity    Worry: Often true    Inability: Often true  . Transportation needs    Medical: Yes    Non-medical: Yes  Tobacco Use  . Smoking status: Never Smoker  . Smokeless tobacco: Never Used  Substance and Sexual Activity  . Alcohol use: Never    Frequency: Never  . Drug use: Never  . Sexual activity: Yes    Birth control/protection: None  Lifestyle  . Physical activity    Days per week: Not on file    Minutes per session: Not on file  . Stress: Not on file  Relationships  . Social Herbalist on phone: Not on file    Gets together:  Not on file    Attends religious service: Not on file    Active member of club or organization: Not on file    Attends meetings of clubs or organizations: Not on file    Relationship status: Not on file  Other Topics Concern  . Not on file  Social History Narrative  . Not on file    Family History  Problem Relation Age of Onset  . Diabetes Mother   . Depression Mother   . Thyroid disease Mother   . Depression Maternal Grandmother   . Diabetes Maternal Grandmother   . Thyroid disease Maternal Grandmother      Current Outpatient Medications:  .  prenatal vitamin w/FE, FA (PRENATAL 1 + 1) 27-1 MG TABS tablet, Take 1 tablet by mouth daily at 12 noon., Disp: 30 tablet, Rfl: 11 .  ferrous sulfate (FERROUSUL) 325 (65 FE) MG tablet, Take 1 tablet (325 mg total) by mouth 2 (two) times daily., Disp: 60 tablet, Rfl: 1  No Known Allergies  Review of Systems: Negative except  for what is mentioned in HPI.  Objective:   Vitals:   01/19/19 0907  BP: 115/78  Pulse: (!) 111  Weight: (!) 314 lb 3.2 oz (142.5 kg)    Fetal Status: Fetal Heart Rate (bpm): 140   Movement: Present     Physical Exam: BP 115/78   Pulse (!) 111   Wt (!) 314 lb 3.2 oz (142.5 kg)   LMP 07/18/2018 (Exact Date)   BMI 51.49 kg/m  CONSTITUTIONAL: Well-developed, well-nourished female in no acute distress.  NEUROLOGIC: Alert and oriented to person, place, and time. Normal reflexes, muscle tone coordination. No cranial nerve deficit noted. PSYCHIATRIC: Normal mood and affect. Normal behavior. Normal judgment and thought content. SKIN: Skin is warm and dry. No rash noted. Not diaphoretic. No erythema. No pallor. HENT:  Normocephalic, atraumatic, External right and left ear normal. Oropharynx is clear and moist EYES: Conjunctivae and EOM are normal. Pupils are equal, round, and reactive to light. No scleral icterus.  NECK: Normal range of motion, supple, no masses CARDIOVASCULAR: Normal heart rate noted, regular  rhythm RESPIRATORY: Effort and breath sounds normal, no problems with respiration noted BREASTS: deferred ABDOMEN: Soft, nontender, nondistended, gravid. GU: normal appearing external female genitalia, multiparous normal appearing cervix, scant white discharge in vagina, no lesions noted MUSCULOSKELETAL: Normal range of motion. EXT:  No edema and no tenderness. 2+ distal pulses.   Assessment and Plan:  Pregnancy: G3P2002 at [redacted]w[redacted]d by LMP  1. Encounter for supervision of low-risk pregnancy, antepartum - Genetic Screening - POCT CBG (Fasting - Glucose) Reviewed [facility] [employer] structure, multiple providers, fellows, medical students, virtual visits, MyChart.   2. Other iron deficiency anemia Start iron   3. Unwanted fertility Considering BTL, signed papers today   Preterm labor symptoms and general obstetric precautions including but not limited to vaginal bleeding, contractions, leaking of fluid and fetal movement were reviewed in detail with the patient.  Please refer to After Visit Summary for other counseling recommendations.   Return in about 2 weeks (around 02/02/2019) for high OB, in person.  Conan Bowens 01/19/2019 10:03 AM

## 2019-01-19 NOTE — Patient Instructions (Addendum)
[place] [facility]  Department of Social [place] [address], [place], [place] [address] [phone]   or  [place]human-services/social-services **SNAP/EBT/ Other nutritional benefits  [place] DHHS-Public Health-WIC [address], [place], [place] [address] [phone]  or  https://[place].gov[place]/human-services/health-department **WIC for  women who are pregnant and postpartum, infants and children up to 5 years old  [facility] [address], [place], [place] [address] [phone]   or   www.theblessedtable.org  **Food pantry  [facility] [address], [place], [place] [address] [phone]   or   https://[facility].godaddysites.com  **Emergency food and prepared meals  [facility] [address], [place], [place] [address] [phone]   or   www.cedargrovetop.us **Food pantry  [facility] [address], [place], [place] [address] [phone]   or   www.facebook.com/[facility]-116430931718202 **Food pantry  [facility] [address], [place], [place] [address] [phone] **Food pantry  [facility] [address], [place], [place] [address] [phone]   or   www.greensborourbanministry.org  **Food pantry and prepared meals  [facility] [address], [place], [place] [address] https://[email or web address]/  **Food pantry  [facility] [address], [place], [place] [address] [phone]   or   www.lbcnow.org  **Food pantry  [facility] [address], [place], [place] [address] [phone]   or   http://www.onestepfurther.com **Food pantry, nutrition education, gardening activities  [facility] [address], [place], [place] [address] [phone] **Food pantry  [facility] [address], [place], [place] [address] [phone]   or   www.salvationarmyof[place].org **Food pantry  [facility] [place] [address], [place], [place] [address] [phone]   or   http://senior-resources[place].org **Meals on Wheels Program  [facility] [address], [place], [place] [address] [phone]   or   www.stmattchurch.com  **Food pantry  [facility] [address], [place],  [address] [phone]   or   [facility].org **Food pantry     

## 2019-01-19 NOTE — BH Specialist Note (Signed)
Integrated Behavioral Health Initial Visit  MRN: [id number] Name: Linda Torres  Number of [facility] Clinician visits:: 1/6 Session Start time: 10:05 Session End time: 11:10 Total time: 1 hour  Type of Service: [place] Interpretor:No. Interpretor Name and Language: N/A   Warm Hand Off Completed.       SUBJECTIVE: Linda Torres is a 23 y.o. female accompanied by n/a Patient was referred by Emeterio Reeve, MD for positive depression screen. Patient reports the following symptoms/concerns: Pt states her primary concern today is depression and anxiety escalating in pregnancy and life stress; pt may consider medication in the future. Recent SI w fleeting plan in the past;  denies SI today, no plan, no intent. Pt agrees to Safety Plan Duration of problem: Ongoing since 23yo; increase in pregnancy; Severity of problem: severe  OBJECTIVE: Mood: Depressed and Affect: Inappropriate Risk of harm to self or others: Suicidal ideation No plan to harm self or others  LIFE CONTEXT: Family and Social: Pt lives with husband, 47yo, 1yo, and a "mother figure"/friend School/Work: Pt's husband working Self-Care: - Life Changes: Current pregnancy, move to [place] from [place]: Patient will: 1. Reduce symptoms of: anxiety, depression and stress 2. Increase knowledge and/or ability of: healthy habits and stress reduction  3. Demonstrate ability to: Increase healthy adjustment to current life circumstances and Increase motivation to adhere to plan of care  INTERVENTIONS: Interventions utilized: Motivational Interviewing, Veterinary surgeon and Psychoeducation and/or Health Education  Standardized Assessments completed: GAD-7, PHQ 9 and PHQ 2&9 with C-SSRS  ASSESSMENT: Patient currently experiencing Major depressive disorder, recurrent, severe, without psychotic features.   Patient may benefit from psychoeducation and brief  therapeutic interventions regarding coping with symptoms of depression and anxiety  .  PLAN: 1. Follow up with behavioral health clinician on : Two weeks 2. Behavioral recommendations:  -Follow Safety Plan -Sign consent sent to phone for [facility] future referrals -Take kids to local park this week -Look up [employer]; consider making plan to attend summer 2021 3. Referral(s): Integrated Orthoptist (In Clinic) and [employer] Resources:  Food, Transportation and Parks/Recreation 4. "From scale of 1-10, how likely are you to follow plan?": -  Garlan Fair, LCSW   Depression screen [facility] 2/9 01/19/2019 01/16/2019  Decreased Interest 2 3  Down, Depressed, Hopeless 2 1  PHQ - 2 Score 4 4  Altered sleeping 1 1  Tired, decreased energy 3 2  Change in appetite 1 1  Feeling bad or failure about yourself  3 2  Trouble concentrating 1 1  Moving slowly or fidgety/restless 0 0  Suicidal thoughts 2 1  PHQ-9 Score 15 12   GAD 7 : Generalized Anxiety Score 01/19/2019 01/16/2019  Nervous, Anxious, on Edge 2 3  Control/stop worrying 1 0  Worry too much - different things 1 0  Trouble relaxing 2 1  Restless 0 0  Easily annoyed or irritable 3 1  Afraid - awful might happen 3 2  Total GAD 7 Score 12 7

## 2019-01-19 NOTE — Addendum Note (Signed)
Addended by: Bethanne Ginger on: 01/19/2019 10:28 AM   Modules accepted: Orders

## 2019-01-20 ENCOUNTER — Encounter: Payer: Self-pay | Admitting: Obstetrics and Gynecology

## 2019-01-20 DIAGNOSIS — O24419 Gestational diabetes mellitus in pregnancy, unspecified control: Secondary | ICD-10-CM | POA: Insufficient documentation

## 2019-01-20 LAB — GLUCOSE TOLERANCE, 2 HOURS W/ 1HR
Glucose, 1 hour: 213 mg/dL — ABNORMAL HIGH (ref 65–179)
Glucose, 2 hour: 195 mg/dL — ABNORMAL HIGH (ref 65–152)
Glucose, Fasting: 120 mg/dL — ABNORMAL HIGH (ref 65–91)

## 2019-01-23 ENCOUNTER — Telehealth: Payer: Self-pay | Admitting: Family Medicine

## 2019-01-23 ENCOUNTER — Encounter: Payer: Self-pay | Admitting: *Deleted

## 2019-01-23 NOTE — Telephone Encounter (Signed)
Attempted to contact patient to get her scheduled with Bev sometime this week. No answer, left voicemail for patient to give the office a call back to be scheduled.

## 2019-01-23 NOTE — Telephone Encounter (Signed)
[employer] called around 1800 reporting BP of 151/103, no recheck. Called patient, there was no answer.  Left a voicemail telling her to call back after she rechecks if the repeat BP reading is still elevated.   Verita Schneiders, MD

## 2019-01-24 ENCOUNTER — Encounter: Payer: Self-pay | Admitting: *Deleted

## 2019-01-24 ENCOUNTER — Ambulatory Visit: Payer: Medicaid Other

## 2019-02-01 ENCOUNTER — Ambulatory Visit: Payer: Medicaid Other

## 2019-02-02 ENCOUNTER — Telehealth: Payer: Self-pay

## 2019-02-02 NOTE — Telephone Encounter (Signed)
Attempted to contact pt with [place] genetic testing results. Left voicemail stating I was calling with results and would send a MyChart message with more detail.   MyChart message sent.

## 2019-02-06 ENCOUNTER — Encounter: Payer: Self-pay | Admitting: Obstetrics and Gynecology

## 2019-02-06 ENCOUNTER — Other Ambulatory Visit: Payer: Self-pay

## 2019-02-06 ENCOUNTER — Ambulatory Visit (INDEPENDENT_AMBULATORY_CARE_PROVIDER_SITE_OTHER): Payer: Medicaid Other | Admitting: Obstetrics and Gynecology

## 2019-02-06 VITALS — BP 123/86 | HR 114 | Wt 313.5 lb

## 2019-02-06 DIAGNOSIS — D563 Thalassemia minor: Secondary | ICD-10-CM | POA: Insufficient documentation

## 2019-02-06 DIAGNOSIS — O24410 Gestational diabetes mellitus in pregnancy, diet controlled: Secondary | ICD-10-CM

## 2019-02-06 DIAGNOSIS — Z3493 Encounter for supervision of normal pregnancy, unspecified, third trimester: Secondary | ICD-10-CM

## 2019-02-06 DIAGNOSIS — O99013 Anemia complicating pregnancy, third trimester: Secondary | ICD-10-CM

## 2019-02-06 DIAGNOSIS — D508 Other iron deficiency anemias: Secondary | ICD-10-CM

## 2019-02-06 DIAGNOSIS — Z3A29 29 weeks gestation of pregnancy: Secondary | ICD-10-CM

## 2019-02-06 DIAGNOSIS — Z3009 Encounter for other general counseling and advice on contraception: Secondary | ICD-10-CM

## 2019-02-06 NOTE — Progress Notes (Signed)
Subjective:  Linda Torres is a 23 y.o. G3P2002 at [redacted]w[redacted]d being seen today for ongoing prenatal care.  She is currently monitored for the following issues for this high-risk pregnancy and has Supervision of low-risk pregnancy; Morbid obesity ([place]); Anemia; Unwanted fertility; GDM (gestational diabetes mellitus); and Alpha thalassemia silent carrier on their problem list.  Patient reports no complaints.  Contractions: Not present. Vag. Bleeding: None.  Movement: Present. Denies leaking of fluid.   The following portions of the patient's history were reviewed and updated as appropriate: allergies, current medications, past family history, past medical history, past social history, past surgical history and problem list. Problem list updated.  Objective:   Vitals:   02/06/19 1505  BP: 123/86  Pulse: (!) 114  Weight: (!) 313 lb 8 oz (142.2 kg)    Fetal Status: Fetal Heart Rate (bpm): 154   Movement: Present     General:  Alert, oriented and cooperative. Patient is in no acute distress.  Skin: Skin is warm and dry. No rash noted.   Cardiovascular: Normal heart rate noted  Respiratory: Normal respiratory effort, no problems with respiration noted  Abdomen: Soft, gravid, appropriate for gestational age. Pain/Pressure: Absent     Pelvic:  Cervical exam deferred        Extremities: Normal range of motion.  Edema: None  Mental Status: Normal mood and affect. Normal behavior. Normal judgment and thought content.   Urinalysis:      Assessment and Plan:  Pregnancy: G3P2002 at [redacted]w[redacted]d  1. Encounter for supervision of low-risk pregnancy in third trimester Stable   2. Diet controlled gestational diabetes mellitus (GDM) in third trimester Has appt with diabetic educator/teaching this Wednesday GDM and pregnancy reviewed with pt. Importance of CBG control for reduction of pregnancy risks reviewed  3. Unwanted fertility BTL papers signed  4. Other iron deficiency anemia Continue with iron  supplement  5. Alpha thalassemia silent carrier Stable  Preterm labor symptoms and general obstetric precautions including but not limited to vaginal bleeding, contractions, leaking of fluid and fetal movement were reviewed in detail with the patient. Please refer to After Visit Summary for other counseling recommendations.  Return in about 2 weeks (around 02/20/2019) for OB visit, face to face.   Chancy Milroy, MD

## 2019-02-06 NOTE — Patient Instructions (Signed)
Gestational Diabetes Mellitus, Diagnosis °Gestational diabetes (gestational diabetes mellitus) is a short-term (temporary) form of diabetes that can happen during pregnancy. It goes away after you give birth. It may be caused by one or both of these problems: °· Your pancreas does not make enough of a hormone called insulin. °· Your body does not respond in a normal way to insulin that it makes. °Insulin lets sugars (glucose) go into cells in the body. This gives you energy. If you have diabetes, sugars cannot get into cells. This causes high blood sugar (hyperglycemia). °If you get gestational diabetes, you are: °· More likely to get it if you get pregnant again. °· More likely to develop type 2 diabetes in the future. °If gestational diabetes is treated, it may not hurt you or your baby. Your doctor will set treatment goals for you. In general, you should have these blood sugar levels: °· After not eating for a long time (fasting): 95 mg/dL (5.3 mmol/L). °· After meals (postprandial): °? One hour after a meal: at or below 140 mg/dL (7.8 mmol/L). °? Two hours after a meal: at or below 120 mg/dL (6.7 mmol/L). °· A1c (hemoglobin A1c) level: 6-6.5%. °Follow these instructions at home: °Questions to ask your doctor ° °· You may want to ask these questions: °? Do I need to meet with a diabetes educator? °? What equipment will I need to care for myself at home? °? What medicines do I need? When should I take them? °? How often do I need to check my blood sugar? °? What number can I call if I have questions? °? When is my next doctor's visit? °General instructions °· Take over-the-counter and prescription medicines only as told by your doctor. °· Stay at a healthy weight during pregnancy. °· Keep all follow-up visits as told by your doctor. This is important. °Contact a doctor if: °· Your blood sugar is at or above 240 mg/dL (13.3 mmol/L). °· Your blood sugar is at or above 200 mg/dL (11.1 mmol/L) and you have ketones in  your pee (urine). °· You have been sick or have had a fever for 2 days or more and you are not getting better. °· You have any of these problems for more than 6 hours: °? You cannot eat or drink. °? You feel sick to your stomach (nauseous). °? You throw up (vomit). °? You have watery poop (diarrhea). °Get help right away if: °· Your blood sugar is lower than 54 mg/dL (3 mmol/L). °· You get confused. °· You have trouble: °? Thinking clearly. °? Breathing. °· Your baby moves less than normal. °· You have any of these: °? Moderate or large ketone levels in your pee. °? Blood coming from your vagina. °? Unusual fluid coming from your vagina. °? Early contractions. These may feel like tightness in your belly. °Summary °· Gestational diabetes is a short-term form of diabetes. It can happen while you are pregnant. It goes away after you give birth. °· If gestational diabetes is treated, it may not hurt you or your baby. Your doctor will set treatment goals for you. °· Keep all follow-up visits as told by your doctor. This is important. °This information is not intended to replace advice given to you by your health care provider. Make sure you discuss any questions you have with your health care provider. °Document Released: 07/29/2015 Document Revised: 05/13/2017 Document Reviewed: 05/10/2015 °Elsevier Patient Education © 2020 [employer] °Third Trimester of Pregnancy °The third trimester is   from week 28 through week 40 (months 7 through 9). The third trimester is a time when the unborn baby (fetus) is growing rapidly. At the end of the ninth month, the fetus is about 20 inches in length and weighs 6-10 pounds. Body changes during your third trimester Your body will continue to go through many changes during pregnancy. The changes vary from woman to woman. During the third trimester:  Your weight will continue to increase. You can expect to gain 25-35 pounds (11-16 kg) by the end of the pregnancy.  You may begin to  get stretch marks on your hips, abdomen, and breasts.  You may urinate more often because the fetus is moving lower into your pelvis and pressing on your bladder.  You may develop or continue to have heartburn. This is caused by increased hormones that slow down muscles in the digestive tract.  You may develop or continue to have constipation because increased hormones slow digestion and cause the muscles that push waste through your intestines to relax.  You may develop hemorrhoids. These are swollen veins (varicose veins) in the rectum that can itch or be painful.  You may develop swollen, bulging veins (varicose veins) in your legs.  You may have increased body aches in the pelvis, back, or thighs. This is due to weight gain and increased hormones that are relaxing your joints.  You may have changes in your hair. These can include thickening of your hair, rapid growth, and changes in texture. Some women also have hair loss during or after pregnancy, or hair that feels dry or thin. Your hair will most likely return to normal after your baby is born.  Your breasts will continue to grow and they will continue to become tender. A yellow fluid (colostrum) may leak from your breasts. This is the first milk you are producing for your baby.  Your belly button may stick out.  You may notice more swelling in your hands, face, or ankles.  You may have increased tingling or numbness in your hands, arms, and legs. The skin on your belly may also feel numb.  You may feel short of breath because of your expanding uterus.  You may have more problems sleeping. This can be caused by the size of your belly, increased need to urinate, and an increase in your body's metabolism.  You may notice the fetus "dropping," or moving lower in your abdomen (lightening).  You may have increased vaginal discharge.  You may notice your joints feel loose and you may have pain around your pelvic bone. What to expect  at prenatal visits You will have prenatal exams every 2 weeks until week 36. Then you will have weekly prenatal exams. During a routine prenatal visit:  You will be weighed to make sure you and the baby are growing normally.  Your blood pressure will be taken.  Your abdomen will be measured to track your baby's growth.  The fetal heartbeat will be listened to.  Any test results from the previous visit will be discussed.  You may have a cervical check near your due date to see if your cervix has softened or thinned (effaced).  You will be tested for Group B streptococcus. This happens between 35 and 37 weeks. Your health care provider may ask you:  What your birth plan is.  How you are feeling.  If you are feeling the baby move.  If you have had any abnormal symptoms, such as leaking fluid, bleeding, severe  headaches, or abdominal cramping.  If you are using any tobacco products, including cigarettes, chewing tobacco, and electronic cigarettes.  If you have any questions. Other tests or screenings that may be performed during your third trimester include:  Blood tests that check for low iron levels (anemia).  Fetal testing to check the health, activity level, and growth of the fetus. Testing is done if you have certain medical conditions or if there are problems during the pregnancy.  Nonstress test (NST). This test checks the health of your baby to make sure there are no signs of problems, such as the baby not getting enough oxygen. During this test, a belt is placed around your belly. The baby is made to move, and its heart rate is monitored during movement. What is false labor? False labor is a condition in which you feel small, irregular tightenings of the muscles in the womb (contractions) that usually go away with rest, changing position, or drinking water. These are called Braxton Hicks contractions. Contractions may last for hours, days, or even weeks before true labor  sets in. If contractions come at regular intervals, become more frequent, increase in intensity, or become painful, you should see your health care provider. What are the signs of labor?  Abdominal cramps.  Regular contractions that start at 10 minutes apart and become stronger and more frequent with time.  Contractions that start on the top of the uterus and spread down to the lower abdomen and back.  Increased pelvic pressure and dull back pain.  A watery or bloody mucus discharge that comes from the vagina.  Leaking of amniotic fluid. This is also known as your "water breaking." It could be a slow trickle or a gush. Let your health care provider know if it has a color or strange odor. If you have any of these signs, call your health care provider right away, even if it is before your due date. Follow these instructions at home: Medicines  Follow your health care provider's instructions regarding medicine use. Specific medicines may be either safe or unsafe to take during pregnancy.  Take a prenatal vitamin that contains at least 600 micrograms (mcg) of folic acid.  If you develop constipation, try taking a stool softener if your health care provider approves. Eating and drinking   Eat a balanced diet that includes fresh fruits and vegetables, whole grains, good sources of protein such as meat, eggs, or tofu, and low-fat dairy. Your health care provider will help you determine the amount of weight gain that is right for you.  Avoid raw meat and uncooked cheese. These carry germs that can cause birth defects in the baby.  If you have low calcium intake from food, talk to your health care provider about whether you should take a daily calcium supplement.  Eat four or five small meals rather than three large meals a day.  Limit foods that are high in fat and processed sugars, such as fried and sweet foods.  To prevent constipation: ? Drink enough fluid to keep your urine clear or  pale yellow. ? Eat foods that are high in fiber, such as fresh fruits and vegetables, whole grains, and beans. Activity  Exercise only as directed by your health care provider. Most women can continue their usual exercise routine during pregnancy. Try to exercise for 30 minutes at least 5 days a week. Stop exercising if you experience uterine contractions.  Avoid heavy lifting.  Do not exercise in extreme heat or humidity,  or at high altitudes.  Wear low-heel, comfortable shoes.  Practice good posture.  You may continue to have sex unless your health care provider tells you otherwise. Relieving pain and discomfort  Take frequent breaks and rest with your legs elevated if you have leg cramps or low back pain.  Take warm sitz baths to soothe any pain or discomfort caused by hemorrhoids. Use hemorrhoid cream if your health care provider approves.  Wear a good support bra to prevent discomfort from breast tenderness.  If you develop varicose veins: ? Wear support pantyhose or compression stockings as told by your healthcare provider. ? Elevate your feet for 15 minutes, 3-4 times a day. Prenatal care  Write down your questions. Take them to your prenatal visits.  Keep all your prenatal visits as told by your health care provider. This is important. Safety  Wear your seat belt at all times when driving.  Make a list of emergency phone numbers, including numbers for family, friends, the hospital, and police and fire departments. General instructions  Avoid cat litter boxes and soil used by cats. These carry germs that can cause birth defects in the baby. If you have a cat, ask someone to clean the litter box for you.  Do not travel far distances unless it is absolutely necessary and only with the approval of your health care provider.  Do not use hot tubs, steam rooms, or saunas.  Do not drink alcohol.  Do not use any products that contain nicotine or tobacco, such as  cigarettes and e-cigarettes. If you need help quitting, ask your health care provider.  Do not use any medicinal herbs or unprescribed drugs. These chemicals affect the formation and growth of the baby.  Do not douche or use tampons or scented sanitary pads.  Do not cross your legs for long periods of time.  To prepare for the arrival of your baby: ? Take prenatal classes to understand, practice, and ask questions about labor and delivery. ? Make a trial run to the hospital. ? Visit the hospital and tour the maternity area. ? Arrange for maternity or paternity leave through employers. ? Arrange for family and friends to take care of pets while you are in the hospital. ? Purchase a rear-facing car seat and make sure you know how to install it in your car. ? Pack your hospital bag. ? Prepare the babys nursery. Make sure to remove all pillows and stuffed animals from the baby's crib to prevent suffocation.  Visit your dentist if you have not gone during your pregnancy. Use a soft toothbrush to brush your teeth and be gentle when you floss. Contact a health care provider if:  You are unsure if you are in labor or if your water has broken.  You become dizzy.  You have mild pelvic cramps, pelvic pressure, or nagging pain in your abdominal area.  You have lower back pain.  You have persistent nausea, vomiting, or diarrhea.  You have an unusual or bad smelling vaginal discharge.  You have pain when you urinate. Get help right away if:  Your water breaks before 37 weeks.  You have regular contractions less than 5 minutes apart before 37 weeks.  You have a fever.  You are leaking fluid from your vagina.  You have spotting or bleeding from your vagina.  You have severe abdominal pain or cramping.  You have rapid weight loss or weight gain.  You have shortness of breath with chest pain.  You  notice sudden or extreme swelling of your face, hands, ankles, feet, or legs.  Your  baby makes fewer than 10 movements in 2 hours.  You have severe headaches that do not go away when you take medicine.  You have vision changes. Summary  The third trimester is from week 28 through week 40, months 7 through 9. The third trimester is a time when the unborn baby (fetus) is growing rapidly.  During the third trimester, your discomfort may increase as you and your baby continue to gain weight. You may have abdominal, leg, and back pain, sleeping problems, and an increased need to urinate.  During the third trimester your breasts will keep growing and they will continue to become tender. A yellow fluid (colostrum) may leak from your breasts. This is the first milk you are producing for your baby.  False labor is a condition in which you feel small, irregular tightenings of the muscles in the womb (contractions) that eventually go away. These are called Braxton Hicks contractions. Contractions may last for hours, days, or even weeks before true labor sets in.  Signs of labor can include: abdominal cramps; regular contractions that start at 10 minutes apart and become stronger and more frequent with time; watery or bloody mucus discharge that comes from the vagina; increased pelvic pressure and dull back pain; and leaking of amniotic fluid. This information is not intended to replace advice given to you by your health care provider. Make sure you discuss any questions you have with your health care provider. Document Released: 03/31/2001 Document Revised: 07/28/2018 Document Reviewed: 05/12/2016 Elsevier Patient Education  2020 [employer]

## 2019-02-07 ENCOUNTER — Ambulatory Visit (HOSPITAL_COMMUNITY): Payer: Medicaid Other

## 2019-02-08 ENCOUNTER — Other Ambulatory Visit: Payer: Medicaid Other

## 2019-02-13 ENCOUNTER — Ambulatory Visit (HOSPITAL_COMMUNITY): Payer: Medicaid Other

## 2019-02-13 ENCOUNTER — Encounter (HOSPITAL_COMMUNITY): Payer: Self-pay

## 2019-02-13 ENCOUNTER — Ambulatory Visit (HOSPITAL_COMMUNITY): Payer: Medicaid Other | Attending: Obstetrics

## 2019-02-16 ENCOUNTER — Other Ambulatory Visit: Payer: Medicaid Other

## 2019-02-16 ENCOUNTER — Encounter: Payer: Self-pay | Admitting: Obstetrics & Gynecology

## 2019-02-21 ENCOUNTER — Encounter: Payer: Self-pay | Admitting: *Deleted

## 2019-02-22 ENCOUNTER — Telehealth: Payer: Self-pay | Admitting: Obstetrics & Gynecology

## 2019-02-22 ENCOUNTER — Encounter: Payer: Medicaid Other | Admitting: Obstetrics & Gynecology

## 2019-02-22 ENCOUNTER — Encounter: Payer: Self-pay | Admitting: Obstetrics & Gynecology

## 2019-02-22 NOTE — Telephone Encounter (Signed)
Attempted to call patient to get her rescheduled for her missed ob appointment. No answer, left voicemail for patient to give the office a call back to be rescheduled.

## 2019-03-06 ENCOUNTER — Telehealth: Payer: Self-pay | Admitting: Obstetrics and Gynecology

## 2019-03-06 NOTE — Telephone Encounter (Signed)
Received call from [facility] regarding elevated BP of 170s/100s. Called patient to tell her to come to [facility], no answer. Left message asking her to call L&D. Will send mychart message.   Feliz Beam, M.D. Attending [facility] for [employer] [facility] [employer])

## 2019-03-06 NOTE — Telephone Encounter (Signed)
Received call from [facility] regarding patient's blood pressure, it is elevated to 174/106. Called patient with callback number from [facility], unable to complete call as dialed. Called number in chart, again, no answer.    Feliz Beam, M.D. Attending [facility] [employer])

## 2019-03-07 ENCOUNTER — Telehealth: Payer: Self-pay | Admitting: General Practice

## 2019-03-07 NOTE — Telephone Encounter (Signed)
-----   Message from Sloan Leiter, MD sent at 03/07/2019  8:12 AM EST ----- Please touch base with patient, she needs to come to [facility] for rule out PEC. I received several calls from [facility] regarding severe range BP, no answer on call or mychart.

## 2019-03-07 NOTE — Telephone Encounter (Signed)
Called patient, no answer- left message stating I am trying to reach you to follow up from Dr Rosana Hoes' message, please call us back. Also tried emergency contact number, but was unable to reach anyone or a voicemail. Per chart review, patient did read Dr Rosana Hoes' message last night.   Will send mychart message.

## 2019-03-14 ENCOUNTER — Encounter: Payer: Self-pay | Admitting: Family Medicine

## 2019-03-14 ENCOUNTER — Encounter: Payer: Medicaid Other | Admitting: Family Medicine

## 2019-03-15 ENCOUNTER — Inpatient Hospital Stay (HOSPITAL_COMMUNITY)
Admission: RE | Admit: 2019-03-15 | Discharge: 2019-03-15 | Disposition: A | Discharge status: Home / Self Care | Payer: Self-pay | Source: Ambulatory Visit | Attending: Obstetrics | Admitting: Obstetrics

## 2019-03-15 ENCOUNTER — Encounter (HOSPITAL_COMMUNITY): Payer: Self-pay

## 2019-03-15 ENCOUNTER — Ambulatory Visit (HOSPITAL_COMMUNITY): Payer: Medicaid Other

## 2019-03-27 ENCOUNTER — Encounter: Payer: Medicaid Other | Admitting: Obstetrics & Gynecology

## 2019-03-27 ENCOUNTER — Encounter: Payer: Self-pay | Admitting: Family Medicine

## 2019-03-30 ENCOUNTER — Other Ambulatory Visit: Payer: Medicaid Other

## 2019-04-03 ENCOUNTER — Other Ambulatory Visit: Payer: Self-pay

## 2019-04-03 ENCOUNTER — Other Ambulatory Visit (HOSPITAL_COMMUNITY)
Admission: RE | Admit: 2019-04-03 | Discharge: 2019-04-03 | Disposition: A | Discharge status: Home / Self Care | Payer: Medicaid Other | Source: Ambulatory Visit | Attending: Obstetrics & Gynecology | Admitting: Obstetrics & Gynecology

## 2019-04-03 ENCOUNTER — Ambulatory Visit (INDEPENDENT_AMBULATORY_CARE_PROVIDER_SITE_OTHER): Payer: Medicaid Other | Admitting: Obstetrics & Gynecology

## 2019-04-03 ENCOUNTER — Ambulatory Visit: Payer: Self-pay

## 2019-04-03 VITALS — BP 139/87 | HR 111 | Wt 320.0 lb

## 2019-04-03 DIAGNOSIS — F439 Reaction to severe stress, unspecified: Secondary | ICD-10-CM | POA: Diagnosis not present

## 2019-04-03 DIAGNOSIS — O99340 Other mental disorders complicating pregnancy, unspecified trimester: Secondary | ICD-10-CM

## 2019-04-03 DIAGNOSIS — O24419 Gestational diabetes mellitus in pregnancy, unspecified control: Secondary | ICD-10-CM | POA: Diagnosis not present

## 2019-04-03 DIAGNOSIS — Z3493 Encounter for supervision of normal pregnancy, unspecified, third trimester: Secondary | ICD-10-CM

## 2019-04-03 DIAGNOSIS — Z9119 Patient's noncompliance with other medical treatment and regimen: Secondary | ICD-10-CM | POA: Diagnosis not present

## 2019-04-03 DIAGNOSIS — Z91199 Patient's noncompliance with other medical treatment and regimen due to unspecified reason: Secondary | ICD-10-CM

## 2019-04-03 DIAGNOSIS — Z3A37 37 weeks gestation of pregnancy: Secondary | ICD-10-CM

## 2019-04-03 LAB — OB RESULTS CONSOLE GBS: GBS: POSITIVE

## 2019-04-03 LAB — GLUCOSE, CAPILLARY: Glucose-Capillary: 134 mg/dL — ABNORMAL HIGH (ref 70–99)

## 2019-04-03 NOTE — Progress Notes (Signed)
   PRENATAL VISIT NOTE  Subjective:  Linda Torres is a 23 y.o. G3P2002 at [redacted]w[redacted]d being seen today for ongoing prenatal care.  She is currently monitored for the following issues for this high-risk pregnancy and has Supervision of low-risk pregnancy; Morbid obesity ([place]); Anemia; Unwanted fertility; GDM (gestational diabetes mellitus); and Alpha thalassemia silent carrier on their problem list.  Patient reports no complaints.  Contractions: Not present. Vag. Bleeding: None.  Movement: Present. Denies leaking of fluid.   The following portions of the patient's history were reviewed and updated as appropriate: allergies, current medications, past family history, past medical history, past social history, past surgical history and problem list.   Objective:   Vitals:   04/03/19 1113  BP: 139/87  Pulse: (!) 111  Weight: (!) 320 lb (145.2 kg)    Fetal Status: Fetal Heart Rate (bpm): 155   Movement: Present     General:  Alert, oriented and cooperative. Patient is in no acute distress.  Skin: Skin is warm and dry. No rash noted.   Cardiovascular: Normal heart rate noted  Respiratory: Normal respiratory effort, no problems with respiration noted  Abdomen: Soft, gravid, appropriate for gestational age.  Pain/Pressure: Present     Pelvic: Cervical exam performed        Extremities: Normal range of motion.  Edema: None  Mental Status: Normal mood and affect. Normal behavior. Normal judgment and thought content.   Assessment and Plan:  Pregnancy: G3P2002 at [redacted]w[redacted]d 1. Encounter for supervision of low-risk pregnancy in third trimester  - Culture, beta strep (group b only) - Cervicovaginal ancillary only( [place]) - f/u MFM u/s ordered 2. GDM- she has not had any followup  U/s, she does not check her sugars  3. Non-compliance- I expressed my worries about risk of still birth. I encouraged her to keep the work in appt with Diane today at 1:15 for a NST and BPP.   Preterm labor symptoms  and general obstetric precautions including but not limited to vaginal bleeding, contractions, leaking of fluid and fetal movement were reviewed in detail with the patient. Please refer to After Visit Summary for other counseling recommendations.   Return for weekly in person.  No future appointments.  Emily Filbert, MD

## 2019-04-04 LAB — CERVICOVAGINAL ANCILLARY ONLY
Chlamydia: NEGATIVE
Comment: NEGATIVE
Comment: NORMAL
Neisseria Gonorrhea: NEGATIVE

## 2019-04-04 LAB — HEMOGLOBIN A1C
Est. average glucose Bld gHb Est-mCnc: 154 mg/dL
Hgb A1c MFr Bld: 7 % — ABNORMAL HIGH (ref 4.8–5.6)

## 2019-04-06 ENCOUNTER — Encounter: Payer: Self-pay | Admitting: Obstetrics & Gynecology

## 2019-04-06 DIAGNOSIS — O99820 Streptococcus B carrier state complicating pregnancy: Secondary | ICD-10-CM | POA: Insufficient documentation

## 2019-04-06 LAB — CULTURE, BETA STREP (GROUP B ONLY): Strep Gp B Culture: POSITIVE — AB

## 2019-04-07 ENCOUNTER — Telehealth (HOSPITAL_COMMUNITY): Payer: Self-pay | Admitting: *Deleted

## 2019-04-07 ENCOUNTER — Other Ambulatory Visit: Payer: Self-pay | Admitting: Obstetrics and Gynecology

## 2019-04-07 ENCOUNTER — Telehealth: Payer: Self-pay

## 2019-04-07 ENCOUNTER — Other Ambulatory Visit: Payer: Self-pay

## 2019-04-07 ENCOUNTER — Ambulatory Visit (HOSPITAL_BASED_OUTPATIENT_CLINIC_OR_DEPARTMENT_OTHER)
Admission: RE | Admit: 2019-04-07 | Discharge: 2019-04-07 | Disposition: A | Discharge status: Home / Self Care | Payer: Medicaid Other | Source: Ambulatory Visit | Attending: Obstetrics | Admitting: Obstetrics

## 2019-04-07 ENCOUNTER — Encounter (HOSPITAL_COMMUNITY): Payer: Self-pay

## 2019-04-07 ENCOUNTER — Ambulatory Visit (HOSPITAL_COMMUNITY): Payer: Medicaid Other | Admitting: *Deleted

## 2019-04-07 ENCOUNTER — Other Ambulatory Visit (HOSPITAL_COMMUNITY): Admission: RE | Admit: 2019-04-07 | Payer: Medicaid Other | Source: Ambulatory Visit

## 2019-04-07 DIAGNOSIS — Z362 Encounter for other antenatal screening follow-up: Secondary | ICD-10-CM | POA: Diagnosis not present

## 2019-04-07 DIAGNOSIS — O99213 Obesity complicating pregnancy, third trimester: Secondary | ICD-10-CM | POA: Diagnosis not present

## 2019-04-07 DIAGNOSIS — O0933 Supervision of pregnancy with insufficient antenatal care, third trimester: Secondary | ICD-10-CM | POA: Diagnosis not present

## 2019-04-07 DIAGNOSIS — O99820 Streptococcus B carrier state complicating pregnancy: Secondary | ICD-10-CM | POA: Insufficient documentation

## 2019-04-07 DIAGNOSIS — O09893 Supervision of other high risk pregnancies, third trimester: Secondary | ICD-10-CM

## 2019-04-07 DIAGNOSIS — Z3A37 37 weeks gestation of pregnancy: Secondary | ICD-10-CM | POA: Diagnosis not present

## 2019-04-07 DIAGNOSIS — O24419 Gestational diabetes mellitus in pregnancy, unspecified control: Secondary | ICD-10-CM

## 2019-04-07 NOTE — Telephone Encounter (Signed)
Preadmission screen  

## 2019-04-07 NOTE — Progress Notes (Signed)
Dr Geroge Baseman, MFM, recommends IOL d/t to uncontrolled GDM and LGA based on today's IOl scheduled Orders placed and pt notified

## 2019-04-07 NOTE — Telephone Encounter (Signed)
Called pt to make aware opf date & time of her Induction, told pt to ignore message from My Chart about the induction. Pt verbalized Understanding.

## 2019-04-08 ENCOUNTER — Inpatient Hospital Stay (HOSPITAL_COMMUNITY): Payer: Medicaid Other

## 2019-04-08 ENCOUNTER — Inpatient Hospital Stay (HOSPITAL_COMMUNITY): Payer: Medicaid Other | Admitting: Anesthesiology

## 2019-04-08 ENCOUNTER — Other Ambulatory Visit: Payer: Self-pay

## 2019-04-08 ENCOUNTER — Encounter (HOSPITAL_COMMUNITY): Payer: Self-pay | Admitting: Obstetrics and Gynecology

## 2019-04-08 ENCOUNTER — Inpatient Hospital Stay (HOSPITAL_COMMUNITY)
Admission: AD | Admit: 2019-04-08 | Discharge: 2019-04-10 | DRG: 807 | Disposition: A | Discharge status: Home / Self Care | Payer: Medicaid Other | Attending: Obstetrics and Gynecology | Admitting: Obstetrics and Gynecology

## 2019-04-08 DIAGNOSIS — O24420 Gestational diabetes mellitus in childbirth, diet controlled: Secondary | ICD-10-CM | POA: Diagnosis not present

## 2019-04-08 DIAGNOSIS — Z3A37 37 weeks gestation of pregnancy: Secondary | ICD-10-CM | POA: Diagnosis not present

## 2019-04-08 DIAGNOSIS — O0930 Supervision of pregnancy with insufficient antenatal care, unspecified trimester: Secondary | ICD-10-CM

## 2019-04-08 DIAGNOSIS — O99214 Obesity complicating childbirth: Secondary | ICD-10-CM | POA: Diagnosis present

## 2019-04-08 DIAGNOSIS — O99824 Streptococcus B carrier state complicating childbirth: Secondary | ICD-10-CM | POA: Diagnosis not present

## 2019-04-08 DIAGNOSIS — Z20828 Contact with and (suspected) exposure to other viral communicable diseases: Secondary | ICD-10-CM | POA: Diagnosis present

## 2019-04-08 DIAGNOSIS — D563 Thalassemia minor: Secondary | ICD-10-CM | POA: Diagnosis present

## 2019-04-08 DIAGNOSIS — O322XX0 Maternal care for transverse and oblique lie, not applicable or unspecified: Secondary | ICD-10-CM | POA: Diagnosis present

## 2019-04-08 DIAGNOSIS — O2492 Unspecified diabetes mellitus in childbirth: Secondary | ICD-10-CM | POA: Diagnosis not present

## 2019-04-08 DIAGNOSIS — O24419 Gestational diabetes mellitus in pregnancy, unspecified control: Secondary | ICD-10-CM | POA: Diagnosis present

## 2019-04-08 DIAGNOSIS — F329 Major depressive disorder, single episode, unspecified: Secondary | ICD-10-CM | POA: Diagnosis present

## 2019-04-08 DIAGNOSIS — O99344 Other mental disorders complicating childbirth: Secondary | ICD-10-CM | POA: Diagnosis present

## 2019-04-08 DIAGNOSIS — F419 Anxiety disorder, unspecified: Secondary | ICD-10-CM | POA: Diagnosis present

## 2019-04-08 DIAGNOSIS — O99820 Streptococcus B carrier state complicating pregnancy: Secondary | ICD-10-CM

## 2019-04-08 HISTORY — DX: Type 2 diabetes mellitus without complications: E11.9

## 2019-04-08 LAB — CBC
HCT: 33.1 % — ABNORMAL LOW (ref 36.0–46.0)
Hemoglobin: 9.5 g/dL — ABNORMAL LOW (ref 12.0–15.0)
MCH: 19.1 pg — ABNORMAL LOW (ref 26.0–34.0)
MCHC: 28.7 g/dL — ABNORMAL LOW (ref 30.0–36.0)
MCV: 66.6 fL — ABNORMAL LOW (ref 80.0–100.0)
Platelets: 248 10*3/uL (ref 150–400)
RBC: 4.97 MIL/uL (ref 3.87–5.11)
RDW: 21.4 % — ABNORMAL HIGH (ref 11.5–15.5)
WBC: 6.8 10*3/uL (ref 4.0–10.5)
nRBC: 0 % (ref 0.0–0.2)

## 2019-04-08 LAB — COMPREHENSIVE METABOLIC PANEL
ALT: 14 U/L (ref 0–44)
AST: 18 U/L (ref 15–41)
Albumin: 2.6 g/dL — ABNORMAL LOW (ref 3.5–5.0)
Alkaline Phosphatase: 97 U/L (ref 38–126)
Anion gap: 10 (ref 5–15)
BUN: 8 mg/dL (ref 6–20)
CO2: 20 mmol/L — ABNORMAL LOW (ref 22–32)
Calcium: 9 mg/dL (ref 8.9–10.3)
Chloride: 106 mmol/L (ref 98–111)
Creatinine, Ser: 0.8 mg/dL (ref 0.44–1.00)
GFR calc Af Amer: >60 mL/min (ref >60)
GFR calc non Af Amer: >60 mL/min (ref >60)
Glucose, Bld: 113 mg/dL — ABNORMAL HIGH (ref 70–99)
Potassium: 4.2 mmol/L (ref 3.5–5.1)
Sodium: 136 mmol/L (ref 135–145)
Total Bilirubin: 0.3 mg/dL (ref 0.3–1.2)
Total Protein: 5.7 g/dL — ABNORMAL LOW (ref 6.5–8.1)

## 2019-04-08 LAB — ABO/RH: ABO/RH(D): O POS

## 2019-04-08 LAB — TYPE AND SCREEN
ABO/RH(D): O POS
Antibody Screen: NEGATIVE

## 2019-04-08 LAB — GLUCOSE, CAPILLARY
Glucose-Capillary: 57 mg/dL — ABNORMAL LOW (ref 70–99)
Glucose-Capillary: 82 mg/dL (ref 70–99)
Glucose-Capillary: 74 mg/dL (ref 70–99)

## 2019-04-08 LAB — SARS CORONAVIRUS 2 (TAT 6-24 HRS): SARS Coronavirus 2: NEGATIVE

## 2019-04-08 LAB — RPR: RPR Ser Ql: NONREACTIVE

## 2019-04-08 MED ORDER — BENZOCAINE-MENTHOL 20-0.5 % EX AERO
1.0000 "application " | INHALATION_SPRAY | CUTANEOUS | Status: DC | PRN
  Administered 2019-04-09: 1 via TOPICAL
  Filled 2019-04-08: qty 56

## 2019-04-08 MED ORDER — SIMETHICONE 80 MG PO CHEW: 80.0000 mg | CHEWABLE_TABLET | ORAL | Status: DC | PRN

## 2019-04-08 MED ORDER — OXYTOCIN BOLUS FROM INFUSION
500.0000 mL | Freq: Once | INTRAVENOUS | Status: AC
  Administered 2019-04-08: 500 mL via INTRAVENOUS

## 2019-04-08 MED ORDER — OXYCODONE-ACETAMINOPHEN 5-325 MG PO TABS: 2.0000 | ORAL_TABLET | ORAL | Status: DC | PRN

## 2019-04-08 MED ORDER — ONDANSETRON HCL 4 MG/2ML IJ SOLN: 4.0000 mg | Freq: Four times a day (QID) | INTRAMUSCULAR | Status: DC | PRN

## 2019-04-08 MED ORDER — TERBUTALINE SULFATE 1 MG/ML IJ SOLN
INTRAMUSCULAR | Status: AC
  Filled 2019-04-08: qty 1

## 2019-04-08 MED ORDER — PRENATAL MULTIVITAMIN CH
1.0000 | ORAL_TABLET | Freq: Every day | ORAL | Status: DC
  Administered 2019-04-09 – 2019-04-10 (×2): 1 via ORAL
  Filled 2019-04-08 (×2): qty 1

## 2019-04-08 MED ORDER — LIDOCAINE HCL (PF) 1 % IJ SOLN: 30.0000 mL | INTRAMUSCULAR | Status: DC | PRN

## 2019-04-08 MED ORDER — OXYTOCIN 40 UNITS IN NORMAL SALINE INFUSION - SIMPLE MED
1.0000 m[IU]/min | INTRAVENOUS | Status: DC
  Administered 2019-04-08: 2 m[IU]/min via INTRAVENOUS

## 2019-04-08 MED ORDER — DIPHENHYDRAMINE HCL 25 MG PO CAPS: 25.0000 mg | ORAL_CAPSULE | Freq: Four times a day (QID) | ORAL | Status: DC | PRN

## 2019-04-08 MED ORDER — SENNOSIDES-DOCUSATE SODIUM 8.6-50 MG PO TABS
2.0000 | ORAL_TABLET | ORAL | Status: DC
  Administered 2019-04-09 (×2): 2 via ORAL
  Filled 2019-04-08 (×2): qty 2

## 2019-04-08 MED ORDER — ONDANSETRON HCL 4 MG PO TABS: 4.0000 mg | ORAL_TABLET | ORAL | Status: DC | PRN

## 2019-04-08 MED ORDER — TERBUTALINE SULFATE 1 MG/ML IJ SOLN: 0.2500 mg | Freq: Once | INTRAMUSCULAR | Status: DC | PRN

## 2019-04-08 MED ORDER — TETANUS-DIPHTH-ACELL PERTUSSIS 5-2.5-18.5 LF-MCG/0.5 IM SUSP: 0.5000 mL | Freq: Once | INTRAMUSCULAR | Status: DC

## 2019-04-08 MED ORDER — LACTATED RINGERS IV SOLN: 500.0000 mL | INTRAVENOUS | Status: DC | PRN

## 2019-04-08 MED ORDER — ONDANSETRON HCL 4 MG/2ML IJ SOLN: 4.0000 mg | INTRAMUSCULAR | Status: DC | PRN

## 2019-04-08 MED ORDER — FENTANYL-BUPIVACAINE-NACL 0.5-0.125-0.9 MG/250ML-% EP SOLN
EPIDURAL | Status: AC
  Filled 2019-04-08: qty 250

## 2019-04-08 MED ORDER — TERBUTALINE SULFATE 1 MG/ML IJ SOLN
0.2500 mg | Freq: Once | INTRAMUSCULAR | Status: AC
  Administered 2019-04-08: 0.25 mg via SUBCUTANEOUS

## 2019-04-08 MED ORDER — OXYTOCIN 40 UNITS IN NORMAL SALINE INFUSION - SIMPLE MED
2.5000 [IU]/h | INTRAVENOUS | Status: DC
  Filled 2019-04-08: qty 1000

## 2019-04-08 MED ORDER — ZOLPIDEM TARTRATE 5 MG PO TABS: 5.0000 mg | ORAL_TABLET | Freq: Every evening | ORAL | Status: DC | PRN

## 2019-04-08 MED ORDER — PENICILLIN G POT IN DEXTROSE 60000 UNIT/ML IV SOLN
3.0000 10*6.[IU] | INTRAVENOUS | Status: DC
  Administered 2019-04-08 (×3): 3 10*6.[IU] via INTRAVENOUS
  Filled 2019-04-08 (×3): qty 50

## 2019-04-08 MED ORDER — SOD CITRATE-CITRIC ACID 500-334 MG/5ML PO SOLN: 30.0000 mL | ORAL | Status: DC | PRN

## 2019-04-08 MED ORDER — LACTATED RINGERS IV SOLN
INTRAVENOUS | Status: DC
  Administered 2019-04-08: 999 mL via INTRAVENOUS

## 2019-04-08 MED ORDER — SERTRALINE HCL 25 MG PO TABS
25.0000 mg | ORAL_TABLET | Freq: Every day | ORAL | Status: DC
  Administered 2019-04-09 – 2019-04-10 (×2): 25 mg via ORAL
  Filled 2019-04-08 (×2): qty 1

## 2019-04-08 MED ORDER — SODIUM CHLORIDE (PF) 0.9 % IJ SOLN
INTRAMUSCULAR | Status: DC | PRN
  Administered 2019-04-08: 12 mL/h via EPIDURAL

## 2019-04-08 MED ORDER — BUPIVACAINE HCL (PF) 0.25 % IJ SOLN
INTRAMUSCULAR | Status: DC | PRN
  Administered 2019-04-08: 5 mL via EPIDURAL

## 2019-04-08 MED ORDER — DIPHENHYDRAMINE HCL 50 MG/ML IJ SOLN
12.5000 mg | Freq: Four times a day (QID) | INTRAMUSCULAR | Status: DC | PRN
  Administered 2019-04-08: 12.5 mg via INTRAVENOUS
  Filled 2019-04-08: qty 1

## 2019-04-08 MED ORDER — WITCH HAZEL-GLYCERIN EX PADS: 1.0000 "application " | MEDICATED_PAD | CUTANEOUS | Status: DC | PRN

## 2019-04-08 MED ORDER — SODIUM CHLORIDE 0.9 % IV SOLN
5.0000 10*6.[IU] | Freq: Once | INTRAVENOUS | Status: AC
  Administered 2019-04-08: 5 10*6.[IU] via INTRAVENOUS
  Filled 2019-04-08: qty 5

## 2019-04-08 MED ORDER — MISOPROSTOL 50MCG HALF TABLET
ORAL_TABLET | ORAL | Status: AC
  Filled 2019-04-08: qty 1

## 2019-04-08 MED ORDER — FENTANYL CITRATE (PF) 100 MCG/2ML IJ SOLN
50.0000 ug | INTRAMUSCULAR | Status: DC | PRN
  Filled 2019-04-08: qty 2

## 2019-04-08 MED ORDER — MISOPROSTOL 50MCG HALF TABLET
50.0000 ug | ORAL_TABLET | Freq: Once | ORAL | Status: AC
  Administered 2019-04-08: 50 ug via BUCCAL

## 2019-04-08 MED ORDER — ACETAMINOPHEN 325 MG PO TABS: 650.0000 mg | ORAL_TABLET | ORAL | Status: DC | PRN

## 2019-04-08 MED ORDER — DIBUCAINE (PERIANAL) 1 % EX OINT: 1.0000 "application " | TOPICAL_OINTMENT | CUTANEOUS | Status: DC | PRN

## 2019-04-08 MED ORDER — IBUPROFEN 600 MG PO TABS
600.0000 mg | ORAL_TABLET | Freq: Four times a day (QID) | ORAL | Status: DC
  Administered 2019-04-09 – 2019-04-10 (×6): 600 mg via ORAL
  Filled 2019-04-08 (×6): qty 1

## 2019-04-08 MED ORDER — FENTANYL CITRATE (PF) 100 MCG/2ML IJ SOLN
INTRAMUSCULAR | Status: DC | PRN
  Administered 2019-04-08: 100 ug via EPIDURAL

## 2019-04-08 MED ORDER — COCONUT OIL OIL: 1.0000 "application " | TOPICAL_OIL | Status: DC | PRN

## 2019-04-08 MED ORDER — OXYCODONE-ACETAMINOPHEN 5-325 MG PO TABS: 1.0000 | ORAL_TABLET | ORAL | Status: DC | PRN

## 2019-04-08 MED ORDER — LIDOCAINE HCL (PF) 1 % IJ SOLN
INTRAMUSCULAR | Status: DC | PRN
  Administered 2019-04-08 (×2): 5 mL via EPIDURAL

## 2019-04-08 NOTE — Progress Notes (Signed)
CNM called to bedside for FHR decelerations after cytotec. Repeated variables with contractions, improving with position changes and fluid bolus. Will continue to monitor.  Wende Mott, CNM 04/08/19 11:40 AM

## 2019-04-08 NOTE — MAU Note (Addendum)
0751 covid swab collected  0756 swab walked to lab

## 2019-04-08 NOTE — H&P (Signed)
OBSTETRIC ADMISSION HISTORY AND PHYSICAL  Linda Torres is a 23 y.o. female G3P2002 with IUP at [redacted]w[redacted]d by LMP presenting for IOL for uncontrolled A1GDM. She reports +FMs, No LOF, no VB, no blurry vision, headaches or peripheral edema, and RUQ pain.  She plans on breast and bottle feeding. She is unsure what she desires for birth control. She received her prenatal care at [facility]    Dating: By LMP --->  Estimated Date of Delivery: 04/24/19  Nursing Staff Provider  Office Location   [place] Dating  LMP  Language   English Anatomy US  Incomplete but normal, f/u scheduled  Flu Vaccine  Declined-01/19/2019 Genetic Screen  NIPS:   AFP:   First Screen:  Quad:    TDaP vaccine   01/19/2019 Hgb A1C or  GTT Early Third trimester: GDM, 7.0  Rhogam   N/A -O+   LAB RESULTS   Feeding Plan Breast Blood Type O/Positive/-- (09/14 1521)   Contraception Tubal Antibody Negative (09/14 1521)  Circumcision Yes Rubella 5.74 (09/14 1521)  Pediatrician  List given RPR Non Reactive (09/14 1521)   Support Person Mardene Celeste Hunter(Mothernlaw) HBsAg Negative (09/14 1521)   Prenatal Classes  offered  HIV Non Reactive (09/14 1521)  BTL Consent Signed 01/19/2019 GBS  positive  VBAC Consent  NA Pap  needs pp    Hgb Electro    BP Cuff  pt has CF  negative    SMA  negative    Waterbirth  [ ]  Class [ ]  Consent [ ]  CNM visit   Prenatal History/Complications:  Past Medical History: Past Medical History:  Diagnosis Date  . Diabetes mellitus without complication ([place])   . Morbid obesity ([place])     Past Surgical History: Past Surgical History:  Procedure Laterality Date  . NO PAST SURGERIES      Obstetrical History: OB History    Gravida  3   Para  2   Term  2   Preterm      AB      Living  2     SAB      TAB      Ectopic      Multiple      Live Births  2           Social History Social History   Socioeconomic History  . Marital status: Married    Spouse name: Not on file  . Number of  children: Not on file  . Years of education: Not on file  . Highest education level: Not on file  Occupational History  . Not on file  Tobacco Use  . Smoking status: Never Smoker  . Smokeless tobacco: Never Used  Substance and Sexual Activity  . Alcohol use: Never  . Drug use: Never  . Sexual activity: Yes    Birth control/protection: None  Other Topics Concern  . Not on file  Social History Narrative  . Not on file   Social Determinants of Health   Financial Resource Strain:   . Difficulty of Paying Living Expenses: Not on file  Food Insecurity: Food Insecurity Present  . Worried About Charity fundraiser in the Last Year: Often true  . Ran Out of Food in the Last Year: Often true  Transportation Needs: Unmet Transportation Needs  . Lack of Transportation (Medical): Yes  . Lack of Transportation (Non-Medical): Yes  Physical Activity:   . Days of Exercise per Week: Not on file  . Minutes of Exercise per  Session: Not on file  Stress:   . Feeling of Stress : Not on file  Social Connections:   . Frequency of Communication with Friends and Family: Not on file  . Frequency of Social Gatherings with Friends and Family: Not on file  . Attends Religious Services: Not on file  . Active Member of Clubs or Organizations: Not on file  . Attends BankerClub or Organization Meetings: Not on file  . Marital Status: Not on file    Family History: Family History  Problem Relation Age of Onset  . Diabetes Mother   . Depression Mother   . Thyroid disease Mother   . Depression Maternal Grandmother   . Diabetes Maternal Grandmother   . Thyroid disease Maternal Grandmother     Allergies: No Known Allergies  Medications Prior to Admission  Medication Sig Dispense Refill Last Dose  . ferrous sulfate (FERROUSUL) 325 (65 FE) MG tablet Take 1 tablet (325 mg total) by mouth 2 (two) times daily. 60 tablet 1   . Prenatal Vit-Fe Fumarate-FA (PRENATAL MULTIVITAMIN) TABS tablet Take 1 tablet by  mouth daily at 12 noon.        Review of Systems   All systems reviewed and negative except as stated in HPI  Blood pressure 130/67, pulse (!) 120, temperature 98 F (36.7 C), temperature source Oral, resp. rate 18, height 5\' 4"  (1.626 m), weight (!) 147.2 kg, last menstrual period 07/18/2018. General appearance: alert, cooperative and no distress Lungs: clear to auscultation bilaterally Heart: regular rate and rhythm Abdomen: soft, non-tender; bowel sounds normal Pelvic: n/a Extremities: Homans sign is negative, no sign of DVT DTR's +2 Presentation: cephalic Fetal monitoringBaseline: 150 bpm, Variability: Good {> 6 bpm), Accelerations: Reactive and Decelerations: Absent Uterine activityNone     Prenatal labs: ABO, Rh: --/--/O POS, O POS Performed at [facility], [address] [place], [place] [address]  [phone](12/19 0839) Antibody: NEG (12/19 [id number]) Rubella: 5.74 (09/14 1521) RPR: Non Reactive (09/14 1521)  HBsAg: Negative (09/14 1521)  HIV: Non Reactive (09/14 1521)  GBS: Positive/-- (12/14 1405)   Prenatal Transfer Tool  Maternal Diabetes: Yes:  Diabetes Type:  Diet controlled, uncontrolled Genetic Screening: Declined Maternal Ultrasounds/Referrals: Normal Fetal Ultrasounds or other Referrals:  Referred to Materal Fetal Medicine , did not keep appointments Maternal Substance Abuse:  No Significant Maternal Medications:  None Significant Maternal Lab Results: Group B Strep negative  Results for orders placed or performed during the hospital encounter of 04/08/19 (from the past 24 hour(s))  CBC   Collection Time: 04/08/19  8:39 AM  Result Value Ref Range   WBC 6.8 4.0 - 10.5 K/uL   RBC 4.97 3.87 - 5.11 MIL/uL   Hemoglobin 9.5 (L) 12.0 - 15.0 g/dL   HCT 47.833.1 (L) 29.536.0 - 62.146.0 %   MCV 66.6 (L) 80.0 - 100.0 fL   MCH 19.1 (L) 26.0 - 34.0 pg   MCHC 28.7 (L) 30.0 - 36.0 g/dL   RDW 30.821.4 (H) 65.711.5 - 84.615.5 %   Platelets 248 150 - 400 K/uL   nRBC 0.0 0.0 - 0.2 %   Comprehensive metabolic panel   Collection Time: 04/08/19  8:39 AM  Result Value Ref Range   Sodium 136 135 - 145 mmol/L   Potassium 4.2 3.5 - 5.1 mmol/L   Chloride 106 98 - 111 mmol/L   CO2 20 (L) 22 - 32 mmol/L   Glucose, Bld 113 (H) 70 - 99 mg/dL   BUN 8 6 - 20  mg/dL   Creatinine, Ser 9.83 0.44 - 1.00 mg/dL   Calcium 9.0 8.9 - 38.2 mg/dL   Total Protein 5.7 (L) 6.5 - 8.1 g/dL   Albumin 2.6 (L) 3.5 - 5.0 g/dL   AST 18 15 - 41 U/L   ALT 14 0 - 44 U/L   Alkaline Phosphatase 97 38 - 126 U/L   Total Bilirubin 0.3 0.3 - 1.2 mg/dL   GFR calc non Af Amer >60 >60 mL/min   GFR calc Af Amer >60 >60 mL/min   Anion gap 10 5 - 15  Type and screen   Collection Time: 04/08/19  8:39 AM  Result Value Ref Range   ABO/RH(D) O POS    Antibody Screen NEG    Sample Expiration      04/11/2019,2359 Performed at [facility], [address] [place], [place] [address]   ABO/Rh   Collection Time: 04/08/19  8:39 AM  Result Value Ref Range   ABO/RH(D)      O POS Performed at [facility], [address], [place], [place] [address]     Patient Active Problem List   Diagnosis Date Noted  . Gestational diabetes 04/08/2019  . Limited prenatal care 04/08/2019  . GBS (group B Streptococcus carrier), +RV culture, currently pregnant 04/06/2019  . Alpha thalassemia silent carrier 02/06/2019  . GDM (gestational diabetes mellitus) 01/20/2019  . Anemia 01/19/2019  . Unwanted fertility 01/19/2019  . Supervision of low-risk pregnancy 01/16/2019  . Morbid obesity ([facility])     Assessment/Plan:  Soleia Badolato is a 23 y.o. G3P2002 at [redacted]w[redacted]d here for IOL for uncontrolled A1GDM  #Labor: cervix closed. Will start with cytotec and FB when able. Patient with hx of rapid labor with unattended delivery in triage. Reassured patient of providers always in house with multiple people available if needed #Pain: Planning epidural #FWB: Cat 1 #ID:  GBS neg #MOF: Both #MOC: unsure, discussed options at  length #Circ:  outpatient  Rolm Bookbinder, CNM  04/08/2019, 10:31 AM

## 2019-04-08 NOTE — Discharge Summary (Signed)
Postpartum Discharge Summary     Patient Name: Linda Torres DOB: January 19, 1996 MRN: [id number]  Date of admission: 04/08/2019 Delivering Provider: Sharene Butters D   Date of discharge: 04/10/2019  Admitting diagnosis: Gestational diabetes [O24.419] Intrauterine pregnancy: [redacted]w[redacted]d    Secondary diagnosis:  Active Problems:   Gestational diabetes   Limited prenatal care   SVD (spontaneous vaginal delivery)  Additional problems: Hx of depression/anxiety      Discharge diagnosis: Term Pregnancy Delivered                                                                                                Post partum procedures:none  Augmentation: Pitocin and Cytotec  Complications: None  Hospital course:  Induction of Labor With Vaginal Delivery   23y.o. yo [id number] [id number] admitted to the hospital 04/08/2019 for induction of labor.  Indication for induction: uncontrolled A1GDM.  Patient had an uncomplicated labor course as follows: Membrane Rupture Time/Date: 7:20 PM ,04/08/2019   Intrapartum Procedures: Episiotomy: None [1]                                         Lacerations:  None [1]  Patient had delivery of a Viable infant.  Information for the patient's newborn:  WiEvalisse, Prajapati0[[id number]]Delivery Method: Vaginal, Spontaneous(Filed from Delivery Summary)    04/08/2019  Details of delivery can be found in separate delivery note.  Patient had a routine postpartum course. Patient is discharged home 04/10/19. Delivery time: 8:54 PM    Magnesium Sulfate received: No BMZ received: No Rhophylac:N/A MMR:N/A Transfusion:No  Physical exam  Vitals:   04/09/19 0830 04/09/19 1300 04/09/19 2155 04/10/19 0537  BP: (!) 153/65 118/79 132/79 131/73  Pulse: (!) 105 95 98 72  Resp: 20 18 18 18   Temp: 99 F (37.2 C) 98.5 F (36.9 C) 98.1 F (36.7 C) 98.2 F (36.8 C)  TempSrc: Oral Oral Oral   SpO2:    98%  Weight:      Height:       General: alert, cooperative and no  distress Lochia: appropriate Uterine Fundus: firm Incision: N/A DVT Evaluation: No evidence of DVT seen on physical exam. No cords or calf tenderness. No significant calf/ankle edema. Labs: Lab Results  Component Value Date   WBC 6.8 04/08/2019   HGB 9.5 (L) 04/08/2019   HCT 33.1 (L) 04/08/2019   MCV 66.6 (L) 04/08/2019   PLT 248 04/08/2019   CMP Latest Ref Rng & Units 04/08/2019  Glucose 70 - 99 mg/dL 113(H)  BUN 6 - 20 mg/dL 8  Creatinine 0.44 - 1.00 mg/dL 0.80  Sodium 135 - 145 mmol/L 136  Potassium 3.5 - 5.1 mmol/L 4.2  Chloride 98 - 111 mmol/L 106  CO2 22 - 32 mmol/L 20(L)  Calcium 8.9 - 10.3 mg/dL 9.0  Total Protein 6.5 - 8.1 g/dL 5.7(L)  Total Bilirubin 0.3 - 1.2 mg/dL 0.3  Alkaline Phos 38 - 126 U/L 97  AST 15 - 41  U/L 18  ALT 0 - 44 U/L 14    Discharge instruction: per After Visit Summary and "Baby and Me Booklet".  After visit meds:  Allergies as of 04/10/2019   No Known Allergies     Medication List    TAKE these medications   acetaminophen 325 MG tablet Commonly known as: Tylenol Take 2 tablets (650 mg total) by mouth every 6 (six) hours as needed.   amLODipine 5 MG tablet Commonly known as: NORVASC Take 1 tablet (5 mg total) by mouth daily.   ferrous sulfate 325 (65 FE) MG tablet Commonly known as: FerrouSul Take 1 tablet (325 mg total) by mouth 2 (two) times daily.   ibuprofen 600 MG tablet Commonly known as: ADVIL Take 1 tablet (600 mg total) by mouth every 6 (six) hours as needed.   prenatal multivitamin Tabs tablet Take 1 tablet by mouth daily at 12 noon.   senna-docusate 8.6-50 MG tablet Commonly known as: Senokot-S Take 2 tablets by mouth at bedtime as needed.   sertraline 25 MG tablet Commonly known as: ZOLOFT Take 1 tablet (25 mg total) by mouth daily.      Diet: routine diet  Activity: Advance as tolerated. Pelvic rest for 6 weeks.   Outpatient follow up:4 weeks Follow up Appt: No future appointments. Follow up  Visit: [place] for [facility]. Schedule an appointment as soon as possible for a visit in 1 week(s).   Specialty: Obstetrics and Gynecology Why: BP check 1 week Postpartum visit 4-6wks with GDM testing Contact information: [address] [place], [place] [id number] [place] [address] [phone]           Please schedule this patient for Postpartum visit in: 4 weeks with the following provider: MD For C/S patients schedule nurse incision check in weeks 2 weeks: no High risk pregnancy complicated by: GDM, elevated BP postpartum Delivery mode:  SVD Anticipated Birth Control:  other/unsure, considering Depo PP Procedures needed: 2 hour GTT  Schedule Integrated BH visit: yes  Pt with labile, but elevated Bps. Per consultation with Dr. Rip Harbour, patient will start on Norvasc 24m through meds-to-beds program and will have BP check in one week. Message sent to [facility] to shcedule with high importance.  Newborn Data: Live born female  Birth Weight: 6 lb 7.4 oz (2931 g) APGAR: 8, 9  Newborn Delivery   Birth date/time: 04/08/2019 20:54:00 Delivery type: Vaginal, Spontaneous      Baby Feeding: Breast Disposition:home with mother   04/10/2019 NClarisa Fling NP

## 2019-04-08 NOTE — Progress Notes (Signed)
Labor Progress Note Linda Torres is a 23 y.o. G3P2002 at [redacted]w[redacted]d presented for IOL uncontrolled gestational diabetes  S:  Patient comfortable with epidural  O:  BP 107/68   Pulse 98   Temp 98 F (36.7 C) (Oral)   Resp 18   Ht 5\' 4"  (1.626 m)   Wt (!) 147.2 kg   LMP 07/18/2018 (Exact Date)   BMI 55.72 kg/m   Fetal Tracing:  Baseline: 150 Variability: minimal Accels: none Decels: variable/late  Toco: 2-8   CVE: Dilation: 2 Effacement (%): Thick Station: -3 Presentation: Vertex Exam by:: Haynes Bast CNM   A&P: 23 y.o. [id number] [redacted]w[redacted]d IOL uncontrolled gestational hypertension #Labor: Loss of variability in FHR and continued variables/lates. Consulted with Dr. Ilda Basset, recommends terbutaline and observation at this time. Will consider starting pitocin with reassuring FHT #Pain: epidural #FWB: Cat 2 #GBS negative  Wende Mott, CNM 2:14 PM

## 2019-04-08 NOTE — Anesthesia Preprocedure Evaluation (Signed)
Anesthesia Evaluation  Patient identified by MRN, date of birth, ID band Patient awake    Reviewed: Allergy & Precautions, H&P , NPO status , Patient's Chart, lab work & pertinent test results  History of Anesthesia Complications Negative for: history of anesthetic complications  Airway Mallampati: II  TM Distance: >3 FB Neck ROM: full    Dental no notable dental hx.    Pulmonary neg pulmonary ROS,    Pulmonary exam normal        Cardiovascular negative cardio ROS Normal cardiovascular exam Rhythm:regular Rate:Normal     Neuro/Psych negative neurological ROS  negative psych ROS   GI/Hepatic negative GI ROS, Neg liver ROS,   Endo/Other  diabetesMorbid obesity  Renal/GU negative Renal ROS  negative genitourinary   Musculoskeletal   Abdominal   Peds  Hematology negative hematology ROS (+)   Anesthesia Other Findings   Reproductive/Obstetrics (+) Pregnancy                             Anesthesia Physical Anesthesia Plan  ASA: III  Anesthesia Plan: Epidural   Post-op Pain Management:    Induction:   PONV Risk Score and Plan:   Airway Management Planned:   Additional Equipment:   Intra-op Plan:   Post-operative Plan:   Informed Consent: I have reviewed the patients History and Physical, chart, labs and discussed the procedure including the risks, benefits and alternatives for the proposed anesthesia with the patient or authorized representative who has indicated his/her understanding and acceptance.       Plan Discussed with:   Anesthesia Plan Comments:         Anesthesia Quick Evaluation  

## 2019-04-08 NOTE — Progress Notes (Signed)
Linda Torres is a 23 y.o. G3P2002 at [redacted]w[redacted]d admitted for induction of labor due to uncontrolled Gestational diabetes.  Subjective: Comfortable with epidural in place  Objective: BP 122/75   Pulse (!) 113   Temp 98 F (36.7 C) (Oral)   Resp 18   Ht 5\' 4"  (1.626 m)   Wt (!) 147.2 kg   LMP 07/18/2018 (Exact Date)   BMI 55.72 kg/m  Total I/O In: 934.2 [P.O.:240; I.V.:694.2] Out: 225 [Urine:225]  FHT:  FHR:130 bpm, variability: moderate ,  accelerations:  Present,  decelerations:  Absent UC:   Minimal on Toco  SVE:   Dilation: 2 Effacement (%): Thick Station: -3 Exam by:: Lynnda Shields RN  Labs: Lab Results  Component Value Date   WBC 6.8 04/08/2019   HGB 9.5 (L) 04/08/2019   HCT 33.1 (L) 04/08/2019   MCV 66.6 (L) 04/08/2019   PLT 248 04/08/2019    Assessment / Plan: Linda Torres is a 23 y.o G3P2002 at [redacted]w[redacted]d here for IO: for uncontrolled GDM  Labor: s/p Cytotec x1, Pitocin.  COntinue to titrate Pitocin.  AROM as appropriate Fetal Wellbeing:  Category I Pain Control:  Epidural GDM: Recent CBG 82.  Continue to monitor CBG q4h I/D:  GBS positive, PCN Anticipated MOD:  Vaginal Delivery, CS as appropriate  Carollee Leitz MD PGY1 Family Med Practice 04/08/2019, 6:13 PM

## 2019-04-08 NOTE — Anesthesia Procedure Notes (Signed)
Epidural Patient location during procedure: OB Start time: 04/08/2019 11:58 AM End time: 04/08/2019 12:13 PM  Staffing Anesthesiologist: Lidia Collum, MD Performed: anesthesiologist   Preanesthetic Checklist Completed: patient identified, IV checked, risks and benefits discussed, monitors and equipment checked, pre-op evaluation and timeout performed  Epidural Patient position: sitting Prep: DuraPrep Patient monitoring: heart rate, continuous pulse ox and blood pressure Approach: midline Location: L3-L4 Injection technique: LOR air  Needle:  Needle type: Tuohy  Needle gauge: 17 G Needle length: 9 cm Needle insertion depth: 9 cm Catheter type: closed end flexible Catheter size: 19 Gauge Catheter at skin depth: 14 cm Test dose: negative  Assessment Events: blood not aspirated, injection not painful, no injection resistance, no paresthesia and negative IV test  Additional Notes Reason for block:procedure for pain

## 2019-04-09 NOTE — Plan of Care (Signed)
  Problem: Education: Goal: Knowledge of General Education information will improve Description: Including pain rating scale, medication(s)/side effects and non-pharmacologic comfort measures Outcome: Completed/Met   Problem: Clinical Measurements: Goal: Ability to maintain clinical measurements within normal limits will improve Outcome: Completed/Met   Problem: Activity: Goal: Risk for activity intolerance will decrease Outcome: Completed/Met

## 2019-04-09 NOTE — Lactation Note (Signed)
This note was copied from a baby's chart. Lactation Consultation Note LC was notified that mom was experienced BF and would call Lactation if she needed any assistance.  Patient Name: Boy Avyana Puffenbarger [id number] Date: 04/09/2019     Maternal Data    Feeding Feeding Type: Bottle Fed - Formula  LATCH Score                   Interventions    Lactation Tools Discussed/Used     Consult Status      Oluwaseyi Raffel, Elta [place] 04/09/2019, 4:36 AM

## 2019-04-09 NOTE — Anesthesia Postprocedure Evaluation (Signed)
Anesthesia Post Note  Patient: Linda Torres  Procedure(s) Performed: AN AD [place]     Patient location during evaluation: Mother Baby Anesthesia Type: Epidural Level of consciousness: awake and alert Pain management: pain level controlled Vital Signs Assessment: post-procedure vital signs reviewed and stable Respiratory status: spontaneous breathing, nonlabored ventilation and respiratory function stable Cardiovascular status: stable Postop Assessment: no headache, no backache and epidural receding Anesthetic complications: no    Last Vitals:  Vitals:   04/09/19 0134 04/09/19 0310  BP: (!) 102/51 (!) 130/51  Pulse: (!) 101 98  Resp: 18 18  Temp: 36.9 C 36.7 C  SpO2:      Last Pain:  Vitals:   04/09/19 0310  TempSrc: Oral  PainSc:    Pain Goal:                   Rayvon Char

## 2019-04-09 NOTE — Progress Notes (Addendum)
POSTPARTUM PROGRESS NOTE  Post Partum Day 1  Subjective:  Linda Torres is a 23 y.o. G3P3003 s/p SVD at [redacted]w[redacted]d.  No acute events overnight.  Pt denies problems with ambulating, voiding or po intake.  She denies nausea or vomiting.  Pain is well controlled.  She has had flatus. She has not had bowel movement.  Lochia Small.   Objective: Blood pressure (!) 130/51, pulse 98, temperature 98.1 F (36.7 C), temperature source Oral, resp. rate 18, height 5\' 4"  (1.626 m), weight (!) 147.2 kg, last menstrual period 07/18/2018, SpO2 100 %, unknown if currently breastfeeding.  Physical Exam:  General: alert, cooperative and no distress Abdomen: soft, nontender,  Uterine Fundus: firm, appropriately tender DVT Evaluation: No calf swelling or tenderness Extremities: no edema Skin: warm, dry  Recent Labs    04/08/19 0839  HGB 9.5*  HCT 33.1*    Assessment/Plan: Linda Torres is a 23 y.o. G6K5993 s/p SVD at [redacted]w[redacted]d   PPD#1 - Doing well Contraception: Unsure at this time, discussed birth control options with patient - considering Depo vs OCPs  Feeding: Breast  Dispo: Plan for discharge tomorrow.  Patient has a hx of anxiety/depression, requested meds for management yesterday- zoloft ordered and initiated    LOS: 1 day   Lajean Manes, CNM 04/09/2019, 8:26 AM

## 2019-04-09 NOTE — Progress Notes (Signed)
CSW received consult due to score of 17 on Edinburgh Depression Screen.    When CSW arrived to room 507, [name] was bonding with infant as evidence by holding infant and engaging in infant's massages.  [name] responded to infant's cues appropriately during assessment and [name] and infant appeared happy and comfortable.   CSW reviewed [name]'s Edinburgh results and [name] acknowledged answering questions honestly; CSW thanked [name].  CSW processed with [name] her results and how to increase [name]'s mood.  [name] shared feeling overwhelmed within her last few days of pregnancy and feeling stressed by the lack of support she received from FOB.  [name] was open to ideas and suggestions to reduce her stress level.  CSW also processed with [name] the benefits of Zoloft and how being consistent with medication will also be helpful; [name] agreed.   CSW provided education regarding Baby Blues vs PMADs and provided [name] with resources for mental health follow up.  CSW encouraged [name] to evaluate her mental health throughout the postpartum period with the use of the New Mom Checklist developed by Postpartum Progress as well as the Edinburgh Postnatal Depression Scale and notify a medical professional if symptoms arise. [name] acknowledged PMAD symptoms with her older 2 children and communicated, "I didn't tell anyone"  CSW encouraged [name] to reach out to her providers if she does not see an improvement in her mood within next 2 weeks or if her symptoms increase; [name] agreed.   CSW assessed for SI, HI, and DV and [name] denied them all.  [name] shared having the support of FOB and FOB's best friend and feeling comfortable seeking additional help is needed.   [name] communicated having all essential items for infant and feeling prepared to parent.   Resources were provided to [name] for outpatient counseling and [name] was receptive.   There are no barriers to discharge.   Charnise Lovan Boyd-Gilyard, MSW, LCSW Clinical Social Work [phone]  

## 2019-04-10 ENCOUNTER — Encounter: Payer: Medicaid Other | Admitting: Obstetrics and Gynecology

## 2019-04-10 ENCOUNTER — Other Ambulatory Visit: Payer: Medicaid Other

## 2019-04-10 MED ORDER — SENNOSIDES-DOCUSATE SODIUM 8.6-50 MG PO TABS: 2.0000 | ORAL_TABLET | Freq: Every evening | ORAL | 0 refills | Status: DC | PRN

## 2019-04-10 MED ORDER — IBUPROFEN 600 MG PO TABS: 600.0000 mg | ORAL_TABLET | Freq: Four times a day (QID) | ORAL | 0 refills | Status: DC | PRN

## 2019-04-10 MED ORDER — SERTRALINE HCL 25 MG PO TABS: 25.0000 mg | ORAL_TABLET | Freq: Every day | ORAL | 1 refills | Status: DC

## 2019-04-10 MED ORDER — AMLODIPINE BESYLATE 5 MG PO TABS: 5.0000 mg | ORAL_TABLET | Freq: Every day | ORAL | 1 refills | Status: DC

## 2019-04-10 MED ORDER — ACETAMINOPHEN 325 MG PO TABS: 650.0000 mg | ORAL_TABLET | Freq: Four times a day (QID) | ORAL | 0 refills | Status: DC | PRN

## 2019-04-10 MED FILL — ACETAMINOPHEN 325 MG TABS: 325 | 4 days supply | Qty: 30 | Fill #0

## 2019-04-10 MED FILL — AMLODIPINE BESYLATE 5 MG TA: 5 | 30 days supply | Qty: 30 | Fill #0

## 2019-04-10 MED FILL — IBUPROFEN 600 MG TABLET: 600 | 8 days supply | Qty: 30 | Fill #0

## 2019-04-10 MED FILL — SERTRALINE HCL 25 MG TABS: 25 | 30 days supply | Qty: 30 | Fill #0

## 2019-04-10 MED FILL — SENEXON-S 8.6-50 MG TABS: 8.6-50 | 15 days supply | Qty: 30 | Fill #0

## 2019-04-10 NOTE — Plan of Care (Signed)
Pts. Condition will continue to improve 

## 2019-04-10 NOTE — Discharge Instructions (Signed)
Preeclampsia and Eclampsia Preeclampsia is a serious condition that may develop during pregnancy. This condition causes high blood pressure and increased protein in your urine along with other symptoms, such as headaches and vision changes. These symptoms may develop as the condition gets worse. Preeclampsia may occur at 20 weeks of pregnancy or later. Diagnosing and treating preeclampsia early is very important. If not treated early, it can cause serious problems for you and your baby. One problem it can lead to is eclampsia. Eclampsia is a condition that causes muscle jerking or shaking (convulsions or seizures) and other serious problems for the mother. During pregnancy, delivering your baby may be the best treatment for preeclampsia or eclampsia. For most women, preeclampsia and eclampsia symptoms go away after giving birth. In rare cases, a woman may develop preeclampsia after giving birth (postpartum preeclampsia). This usually occurs within 48 hours after childbirth but may occur up to 6 weeks after giving birth. What are the causes? The cause of preeclampsia is not known. What increases the risk? The following risk factors make you more likely to develop preeclampsia:  Being pregnant for the first time.  Having had preeclampsia during a past pregnancy.  Having a family history of preeclampsia.  Having high blood pressure.  Being pregnant with more than one baby.  Being 35 or older.  Being African-American.  Having kidney disease or diabetes.  Having medical conditions such as lupus or blood diseases.  Being very overweight (obese). What are the signs or symptoms? The most common symptoms are:  Severe headaches.  Vision problems, such as blurred or double vision.  Abdominal pain, especially upper abdominal pain. Other symptoms that may develop as the condition gets worse include:  Sudden weight gain.  Sudden swelling of the hands, face, legs, and feet.  Severe nausea  and vomiting.  Numbness in the face, arms, legs, and feet.  Dizziness.  Urinating less than usual.  Slurred speech.  Convulsions or seizures. How is this diagnosed? There are no screening tests for preeclampsia. Your health care provider will ask you about symptoms and check for signs of preeclampsia during your prenatal visits. You may also have tests that include:  Checking your blood pressure.  Urine tests to check for protein. Your health care provider will check for this at every prenatal visit.  Blood tests.  Monitoring your baby's heart rate.  Ultrasound. How is this treated? You and your health care provider will determine the treatment approach that is best for you. Treatment may include:  Having more frequent prenatal exams to check for signs of preeclampsia, if you have an increased risk for preeclampsia.  Medicine to lower your blood pressure.  Staying in the hospital, if your condition is severe. There, treatment will focus on controlling your blood pressure and the amount of fluids in your body (fluid retention).  Taking medicine (magnesium sulfate) to prevent seizures. This may be given as an injection or through an IV.  Taking a low-dose aspirin during your pregnancy.  Delivering your baby early. You may have your labor started with medicine (induced), or you may have a cesarean delivery. Follow these instructions at home: Eating and drinking   Drink enough fluid to keep your urine pale yellow.  Avoid caffeine. Lifestyle  Do not use any products that contain nicotine or tobacco, such as cigarettes and e-cigarettes. If you need help quitting, ask your health care provider.  Do not use alcohol or drugs.  Avoid stress as much as possible. Rest and get   plenty of sleep. General instructions  Take over-the-counter and prescription medicines only as told by your health care provider.  When lying down, lie on your left side. This keeps pressure off your  major blood vessels.  When sitting or lying down, raise (elevate) your feet. Try putting some pillows underneath your lower legs.  Exercise regularly. Ask your health care provider what kinds of exercise are best for you.  Keep all follow-up and prenatal visits as told by your health care provider. This is important. How is this prevented? There is no known way of preventing preeclampsia or eclampsia from developing. However, to lower your risk of complications and detect problems early:  Get regular prenatal care. Your health care provider may be able to diagnose and treat the condition early.  Maintain a healthy weight. Ask your health care provider for help managing weight gain during pregnancy.  Work with your health care provider to manage any long-term (chronic) health conditions you have, such as diabetes or kidney problems.  You may have tests of your blood pressure and kidney function after giving birth.  Your health care provider may have you take low-dose aspirin during your next pregnancy. Contact a health care provider if:  You have symptoms that your health care provider told you may require more treatment or monitoring, such as: ? Headaches. ? Nausea or vomiting. ? Abdominal pain. ? Dizziness. ? Light-headedness. Get help right away if:  You have severe: ? Abdominal pain. ? Headaches that do not get better. ? Dizziness. ? Vision problems. ? Confusion. ? Nausea or vomiting.  You have any of the following: ? A seizure. ? Sudden, rapid weight gain. ? Sudden swelling in your hands, ankles, or face. ? Trouble moving any part of your body. ? Numbness in any part of your body. ? Trouble speaking. ? Abnormal bleeding.  You faint. Summary  Preeclampsia is a serious condition that may develop during pregnancy.  This condition causes high blood pressure and increased protein in your urine along with other symptoms, such as headaches and vision  changes.  Diagnosing and treating preeclampsia early is very important. If not treated early, it can cause serious problems for you and your baby.  Get help right away if you have symptoms that your health care provider told you to watch for. This information is not intended to replace advice given to you by your health care provider. Make sure you discuss any questions you have with your health care provider. Document Released: 04/03/2000 Document Revised: 12/07/2017 Document Reviewed: 11/11/2015 Elsevier Patient Education  2020 Elsevier Inc. Postpartum Hypertension Postpartum hypertension is high blood pressure that remains higher than normal after childbirth. You may not realize that you have postpartum hypertension if your blood pressure is not being checked regularly. In most cases, postpartum hypertension will go away on its own, usually within a week of delivery. However, for some women, medical treatment is required to prevent serious complications, such as seizures or stroke. What are the causes? This condition may be caused by one or more of the following:  Hypertension that existed before pregnancy (chronic hypertension).  Hypertension that comes on as a result of pregnancy (gestational hypertension).  Hypertensive disorders during pregnancy (preeclampsia) or seizures in women who have high blood pressure during pregnancy (eclampsia).  A condition in which the liver, platelets, and red blood cells are damaged during pregnancy (HELLP syndrome).  A condition in which the thyroid produces too much hormones (hyperthyroidism).  Other rare problems of the  nerves (neurological disorders) or blood disorders. In some cases, the cause may not be known. What increases the risk? The following factors may make you more likely to develop this condition:  Chronic hypertension. In some cases, this may not have been diagnosed before pregnancy.  Obesity.  Type 2 diabetes.  Kidney  disease.  History of preeclampsia or eclampsia.  Other medical conditions that change the level of hormones in the body (hormonal imbalance). What are the signs or symptoms? As with all types of hypertension, postpartum hypertension may not have any symptoms. Depending on how high your blood pressure is, you may experience:  Headaches. These may be mild, moderate, or severe. They may also be steady, constant, or sudden in onset (thunderclap headache).  Changes in your ability to see (visual changes).  Dizziness.  Shortness of breath.  Swelling of your hands, feet, lower legs, or face. In some cases, you may have swelling in more than one of these locations.  Heart palpitations or a racing heartbeat.  Difficulty breathing while lying down.  Decrease in the amount of urine that you pass. Other rare signs and symptoms may include:  Sweating more than usual. This lasts longer than a few days after delivery.  Chest pain.  Sudden dizziness when you get up from sitting or lying down.  Seizures.  Nausea or vomiting.  Abdominal pain. How is this diagnosed? This condition may be diagnosed based on the results of a physical exam, blood pressure measurements, and blood and urine tests. You may also have other tests, such as a CT scan or an MRI, to check for other problems of postpartum hypertension. How is this treated? If blood pressure is high enough to require treatment, your options may include:  Medicines to reduce blood pressure (antihypertensives). Tell your health care provider if you are breastfeeding or if you plan to breastfeed. There are many antihypertensive medicines that are safe to take while breastfeeding.  Stopping medicines that may be causing hypertension.  Treating medical conditions that are causing hypertension.  Treating the complications of hypertension, such as seizures, stroke, or kidney problems. Your health care provider will also continue to monitor  your blood pressure closely until it is within a safe range for you. Follow these instructions at home:  Take over-the-counter and prescription medicines only as told by your health care provider.  Return to your normal activities as told by your health care provider. Ask your health care provider what activities are safe for you.  Do not use any products that contain nicotine or tobacco, such as cigarettes and e-cigarettes. If you need help quitting, ask your health care provider.  Keep all follow-up visits as told by your health care provider. This is important. Contact a health care provider if:  Your symptoms get worse.  You have new symptoms, such as: ? A headache that does not get better. ? Dizziness. ? Visual changes. Get help right away if:  You suddenly develop swelling in your hands, ankles, or face.  You have sudden, rapid weight gain.  You develop difficulty breathing, chest pain, racing heartbeat, or heart palpitations.  You develop severe pain in your abdomen.  You have any symptoms of a stroke. "BE FAST" is an easy way to remember the main warning signs of a stroke: ? B - Balance. Signs are dizziness, sudden trouble walking, or loss of balance. ? E - Eyes. Signs are trouble seeing or a sudden change in vision. ? F - Face. Signs are sudden  weakness or numbness of the face, or the face or eyelid drooping on one side. ? A - Arms. Signs are weakness or numbness in an arm. This happens suddenly and usually on one side of the body. ? S - Speech. Signs are sudden trouble speaking, slurred speech, or trouble understanding what people say. ? T - Time. Time to call emergency services. Write down what time symptoms started.  You have other signs of a stroke, such as: ? A sudden, severe headache with no known cause. ? Nausea or vomiting. ? Seizure. These symptoms may represent a serious problem that is an emergency. Do not wait to see if the symptoms will go away. Get  medical help right away. Call your local emergency services (911 in the [place] Do not drive yourself to the hospital. Summary  Postpartum hypertension is high blood pressure that remains higher than normal after childbirth.  In most cases, postpartum hypertension will go away on its own, usually within a week of delivery.  For some women, medical treatment is required to prevent serious complications, such as seizures or stroke. This information is not intended to replace advice given to you by your health care provider. Make sure you discuss any questions you have with your health care provider. Document Released: 12/08/2013 Document Revised: 05/13/2018 Document Reviewed: 01/25/2017 Elsevier Patient Education  2020 Elsevier Inc. Postpartum Care After Vaginal Delivery This sheet gives you information about how to care for yourself from the time you deliver your baby to up to 6-12 weeks after delivery (postpartum period). Your health care provider may also give you more specific instructions. If you have problems or questions, contact your health care provider. Follow these instructions at home: Vaginal bleeding  It is normal to have vaginal bleeding (lochia) after delivery. Wear a sanitary pad for vaginal bleeding and discharge. ? During the first week after delivery, the amount and appearance of lochia is often similar to a menstrual period. ? Over the next few weeks, it will gradually decrease to a dry, yellow-brown discharge. ? For most women, lochia stops completely by 4-6 weeks after delivery. Vaginal bleeding can vary from woman to woman.  Change your sanitary pads frequently. Watch for any changes in your flow, such as: ? A sudden increase in volume. ? A change in color. ? Large blood clots.  If you pass a blood clot from your vagina, save it and call your health care provider to discuss. Do not flush blood clots down the toilet before talking with your health care provider.  Do  not use tampons or douches until your health care provider says this is safe.  If you are not breastfeeding, your period should return 6-8 weeks after delivery. If you are feeding your child breast milk only (exclusive breastfeeding), your period may not return until you stop breastfeeding. Perineal care  Keep the area between the vagina and the anus (perineum) clean and dry as told by your health care provider. Use medicated pads and pain-relieving sprays and creams as directed.  If you had a cut in the perineum (episiotomy) or a tear in the vagina, check the area for signs of infection until you are healed. Check for: ? More redness, swelling, or pain. ? Fluid or blood coming from the cut or tear. ? Warmth. ? Pus or a bad smell.  You may be given a squirt bottle to use instead of wiping to clean the perineum area after you go to the bathroom. As you start healing,  you may use the squirt bottle before wiping yourself. Make sure to wipe gently.  To relieve pain caused by an episiotomy, a tear in the vagina, or swollen veins in the anus (hemorrhoids), try taking a warm sitz bath 2-3 times a day. A sitz bath is a warm water bath that is taken while you are sitting down. The water should only come up to your hips and should cover your buttocks. Breast care  Within the first few days after delivery, your breasts may feel heavy, full, and uncomfortable (breast engorgement). Milk may also leak from your breasts. Your health care provider can suggest ways to help relieve the discomfort. Breast engorgement should go away within a few days.  If you are breastfeeding: ? Wear a bra that supports your breasts and fits you well. ? Keep your nipples clean and dry. Apply creams and ointments as told by your health care provider. ? You may need to use breast pads to absorb milk that leaks from your breasts. ? You may have uterine contractions every time you breastfeed for up to several weeks after delivery.  Uterine contractions help your uterus return to its normal size. ? If you have any problems with breastfeeding, work with your health care provider or Advertising copywriter.  If you are not breastfeeding: ? Avoid touching your breasts a lot. Doing this can make your breasts produce more milk. ? Wear a good-fitting bra and use cold packs to help with swelling. ? Do not squeeze out (express) milk. This causes you to make more milk. Intimacy and sexuality  Ask your health care provider when you can engage in sexual activity. This may depend on: ? Your risk of infection. ? How fast you are healing. ? Your comfort and desire to engage in sexual activity.  You are able to get pregnant after delivery, even if you have not had your period. If desired, talk with your health care provider about methods of birth control (contraception). Medicines  Take over-the-counter and prescription medicines only as told by your health care provider.  If you were prescribed an antibiotic medicine, take it as told by your health care provider. Do not stop taking the antibiotic even if you start to feel better. Activity  Gradually return to your normal activities as told by your health care provider. Ask your health care provider what activities are safe for you.  Rest as much as possible. Try to rest or take a nap while your baby is sleeping. Eating and drinking   Drink enough fluid to keep your urine pale yellow.  Eat high-fiber foods every day. These may help prevent or relieve constipation. High-fiber foods include: ? Whole grain cereals and breads. ? Brown rice. ? Beans. ? Fresh fruits and vegetables.  Do not try to lose weight quickly by cutting back on calories.  Take your prenatal vitamins until your postpartum checkup or until your health care provider tells you it is okay to stop. Lifestyle  Do not use any products that contain nicotine or tobacco, such as cigarettes and e-cigarettes. If you  need help quitting, ask your health care provider.  Do not drink alcohol, especially if you are breastfeeding. General instructions  Keep all follow-up visits for you and your baby as told by your health care provider. Most women visit their health care provider for a postpartum checkup within the first 3-6 weeks after delivery. Contact a health care provider if:  You feel unable to cope with the changes that  your child brings to your life, and these feelings do not go away.  You feel unusually sad or worried.  Your breasts become red, painful, or hard.  You have a fever.  You have trouble holding urine or keeping urine from leaking.  You have little or no interest in activities you used to enjoy.  You have not breastfed at all and you have not had a menstrual period for 12 weeks after delivery.  You have stopped breastfeeding and you have not had a menstrual period for 12 weeks after you stopped breastfeeding.  You have questions about caring for yourself or your baby.  You pass a blood clot from your vagina. Get help right away if:  You have chest pain.  You have difficulty breathing.  You have sudden, severe leg pain.  You have severe pain or cramping in your lower abdomen.  You bleed from your vagina so much that you fill more than one sanitary pad in one hour. Bleeding should not be heavier than your heaviest period.  You develop a severe headache.  You faint.  You have blurred vision or spots in your vision.  You have bad-smelling vaginal discharge.  You have thoughts about hurting yourself or your baby. If you ever feel like you may hurt yourself or others, or have thoughts about taking your own life, get help right away. You can go to the nearest emergency department or call:  Your local emergency services (911 in the [place]  A suicide crisis helpline, such as the National Suicide Prevention Lifeline at 713-152-32311-(316) 642-5861. This is open 24 hours a  day. Summary  The period of time right after you deliver your newborn up to 6-12 weeks after delivery is called the postpartum period.  Gradually return to your normal activities as told by your health care provider.  Keep all follow-up visits for you and your baby as told by your health care provider. This information is not intended to replace advice given to you by your health care provider. Make sure you discuss any questions you have with your health care provider. Document Released: 02/01/2007 Document Revised: 04/09/2017 Document Reviewed: 01/18/2017 Elsevier Patient Education  2020 [employer]

## 2019-04-11 NOTE — BH Specialist Note (Signed)
Pt did not arrive to video visit and did not answer the phone ; [phone] number unable to leave message as "not accepting calls at this time"; Call to [phone]: Left HIPPA-compliant message to call back [name] from [facility] at [phone].  ; left MyChart message for patient.   [place] via Telemedicine Video Visit  04/11/2019 Linda Torres [id number]  [place]

## 2019-04-19 ENCOUNTER — Encounter: Payer: Medicaid Other | Admitting: Obstetrics and Gynecology

## 2019-04-19 ENCOUNTER — Ambulatory Visit: Payer: Medicaid Other

## 2019-04-19 ENCOUNTER — Other Ambulatory Visit: Payer: Medicaid Other

## 2019-04-20 ENCOUNTER — Ambulatory Visit: Payer: Medicaid Other | Admitting: Clinical

## 2019-04-20 ENCOUNTER — Other Ambulatory Visit: Payer: Self-pay

## 2019-04-20 DIAGNOSIS — Z5329 Procedure and treatment not carried out because of patient's decision for other reasons: Secondary | ICD-10-CM

## 2019-04-20 DIAGNOSIS — Z91199 Patient's noncompliance with other medical treatment and regimen due to unspecified reason: Secondary | ICD-10-CM

## 2019-05-15 ENCOUNTER — Ambulatory Visit: Payer: Medicaid Other | Admitting: Family Medicine

## 2019-05-15 ENCOUNTER — Encounter: Payer: Self-pay | Admitting: Family Medicine

## 2019-05-15 ENCOUNTER — Telehealth: Payer: Self-pay | Admitting: Family Medicine

## 2019-05-15 NOTE — Telephone Encounter (Signed)
Attempted to contact patient to get her rescheduled for her miss postpartum visit. No answer, number rang several times without switching over to voicemail. No show letter sent.

## 2019-05-17 ENCOUNTER — Other Ambulatory Visit: Payer: Self-pay | Admitting: Lactation Services

## 2019-05-17 DIAGNOSIS — Z3493 Encounter for supervision of normal pregnancy, unspecified, third trimester: Secondary | ICD-10-CM

## 2019-05-23 ENCOUNTER — Other Ambulatory Visit: Payer: Medicaid Other

## 2020-04-20 NOTE — L&D Delivery Note (Signed)
Delivery Note Linda Torres is a 25 y.o. G4P3003 at [redacted]w[redacted]d admitted for SOL.   GBS Status: unknown  Maximum Maternal Temperature: 98.4  Labor course: Initial SVE: 7/90/-1. Augmentation with: AROM. She then progressed to complete.  ROM: 0h 64m with clear fluid  Birth: At 0731 a viable female was delivered via spontaneous vaginal delivery (Presentation: cephalic; MOA). Nuchal cord not present. Shoulders and body delivered in usual fashion. Infant placed directly on mom's abdomen for bonding/skin-to-skin, baby dried and stimulated. Cord clamped x 2 after 1 minute and cut by patient's sister. Cord blood collected. The placenta separated spontaneously and delivered via gentle cord traction. Pitocin infused rapidly IV per protocol.  Fundus firm with massage.  Placenta inspected and appears to be intact with a 3 VC.  Placenta/Cord with the following complications: None.  Cord pH: n/a Sponge and instrument count were correct x2.  Intrapartum complications:  None Anesthesia:  none Episiotomy: none Lacerations:  none Suture Repair: n/a EBL (mL): 25 ml   Infant: APGAR (1 MIN): 9   APGAR (5 MINS): 9   Infant weight: pending  Mom to postpartum.  Baby to Couplet care / Skin to Skin. Placenta to L&D   Plans to Breastfeed Contraception: undecided Circumcision: N/A  Note sent to [facility]: [facility] for pp visit.  Brand Males CNM  08/29/2020 7:54 AM

## 2020-06-20 ENCOUNTER — Other Ambulatory Visit: Payer: Self-pay

## 2020-06-20 ENCOUNTER — Ambulatory Visit (INDEPENDENT_AMBULATORY_CARE_PROVIDER_SITE_OTHER): Payer: Medicaid Other | Admitting: *Deleted

## 2020-06-20 VITALS — BP 129/84 | HR 118

## 2020-06-20 DIAGNOSIS — Z3A29 29 weeks gestation of pregnancy: Secondary | ICD-10-CM | POA: Diagnosis not present

## 2020-06-20 DIAGNOSIS — Z3480 Encounter for supervision of other normal pregnancy, unspecified trimester: Secondary | ICD-10-CM

## 2020-06-20 NOTE — Progress Notes (Signed)
Agree with A & P. 

## 2020-06-20 NOTE — Progress Notes (Signed)
Linda Torres presents today for UPT. She has no unusual complaints.  LMP: ?11/27/2019    OBJECTIVE: Appears well, in no apparent distress.   OB History    Gravida  3   Para  3   Term  3   Preterm      AB      Living  3     SAB      IAB      Ectopic      Multiple  0   Live Births  3          Home UPT Result: Positive  In-Office UPT result:Positive  I have reviewed the patient's medical, obstetrical, social, and family histories, and medications.   ASSESSMENT: Positive pregnancy test  PLAN Prenatal care to be completed at: [facility] intake completed today Orders Placed This Encounter  Procedures  . [place] MFM OB DETAIL +14 WK

## 2020-07-03 DIAGNOSIS — Z3480 Encounter for supervision of other normal pregnancy, unspecified trimester: Secondary | ICD-10-CM | POA: Insufficient documentation

## 2020-07-04 ENCOUNTER — Other Ambulatory Visit: Payer: Medicaid Other

## 2020-07-04 ENCOUNTER — Encounter: Payer: Medicaid Other | Admitting: Obstetrics and Gynecology

## 2020-07-04 DIAGNOSIS — Z3480 Encounter for supervision of other normal pregnancy, unspecified trimester: Secondary | ICD-10-CM

## 2020-07-05 ENCOUNTER — Other Ambulatory Visit: Payer: Self-pay | Admitting: *Deleted

## 2020-07-05 ENCOUNTER — Other Ambulatory Visit: Payer: Self-pay

## 2020-07-05 ENCOUNTER — Encounter: Payer: Self-pay | Admitting: *Deleted

## 2020-07-05 ENCOUNTER — Ambulatory Visit: Payer: Medicaid Other | Attending: Obstetrics and Gynecology

## 2020-07-05 ENCOUNTER — Ambulatory Visit: Payer: Medicaid Other | Admitting: *Deleted

## 2020-07-05 DIAGNOSIS — Z3480 Encounter for supervision of other normal pregnancy, unspecified trimester: Secondary | ICD-10-CM | POA: Diagnosis not present

## 2020-07-05 DIAGNOSIS — Z3A29 29 weeks gestation of pregnancy: Secondary | ICD-10-CM | POA: Insufficient documentation

## 2020-07-05 DIAGNOSIS — O0933 Supervision of pregnancy with insufficient antenatal care, third trimester: Secondary | ICD-10-CM

## 2020-07-05 DIAGNOSIS — E669 Obesity, unspecified: Secondary | ICD-10-CM | POA: Insufficient documentation

## 2020-07-05 DIAGNOSIS — O99213 Obesity complicating pregnancy, third trimester: Secondary | ICD-10-CM | POA: Insufficient documentation

## 2020-07-09 ENCOUNTER — Encounter: Payer: Medicaid Other | Admitting: Obstetrics

## 2020-07-09 ENCOUNTER — Telehealth: Payer: Self-pay | Admitting: Obstetrics

## 2020-07-11 ENCOUNTER — Encounter: Payer: Medicaid Other | Admitting: Obstetrics and Gynecology

## 2020-07-11 ENCOUNTER — Other Ambulatory Visit: Payer: Medicaid Other

## 2020-07-26 ENCOUNTER — Ambulatory Visit: Payer: Medicaid Other

## 2020-07-26 ENCOUNTER — Ambulatory Visit: Payer: Medicaid Other | Attending: Obstetrics and Gynecology

## 2020-07-31 IMAGING — US US FETAL BPP W/ NON-STRESS
1 series · 12 of 12 positions shown · non-contrast
Comparison: none

[Series 1: us fetal bpp w/nonstress · 12 acquisitions, 12 frames shown]
[im 1/12]
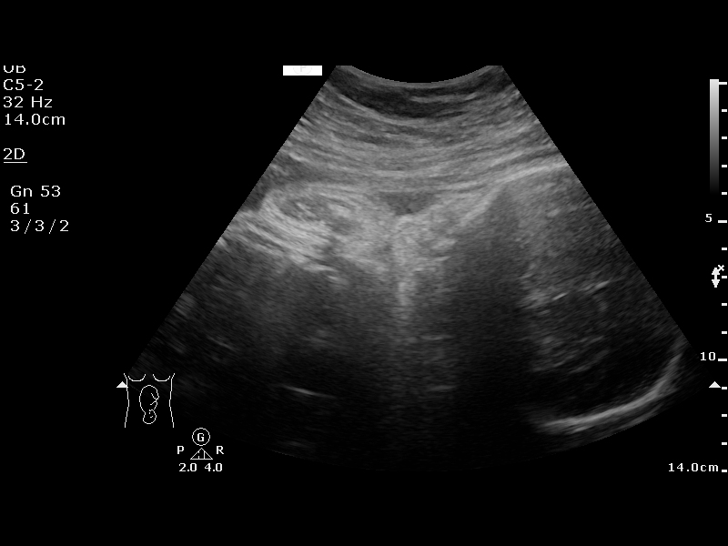
[im 2/12]
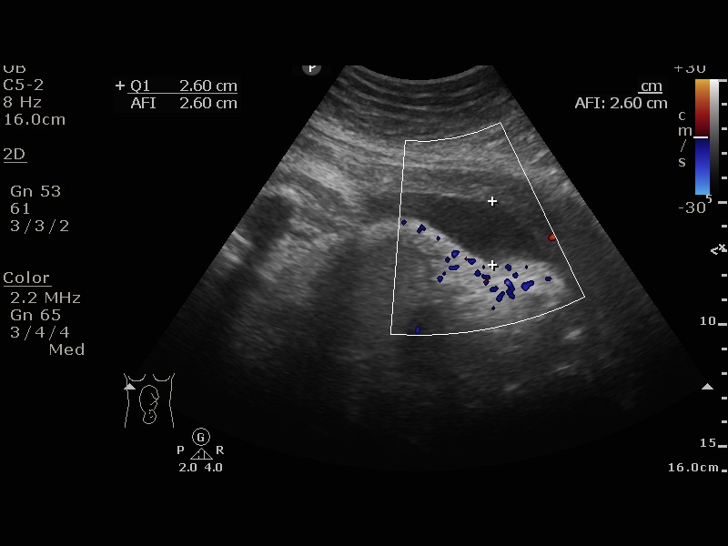
[im 3/12]
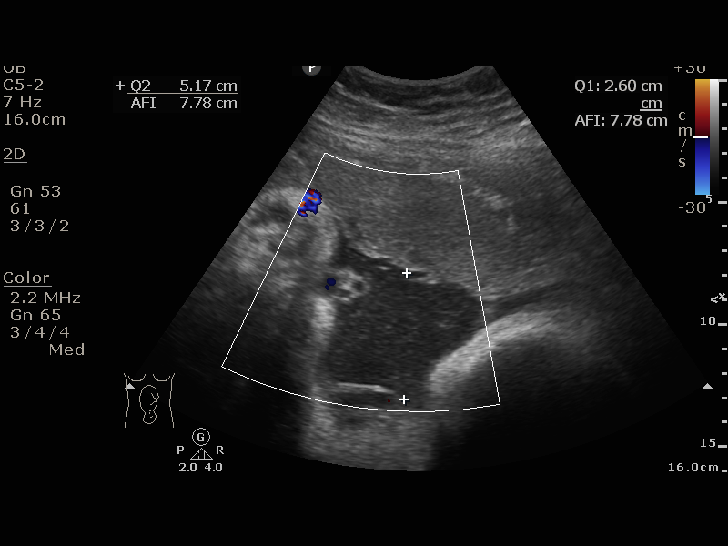
[im 4/12]
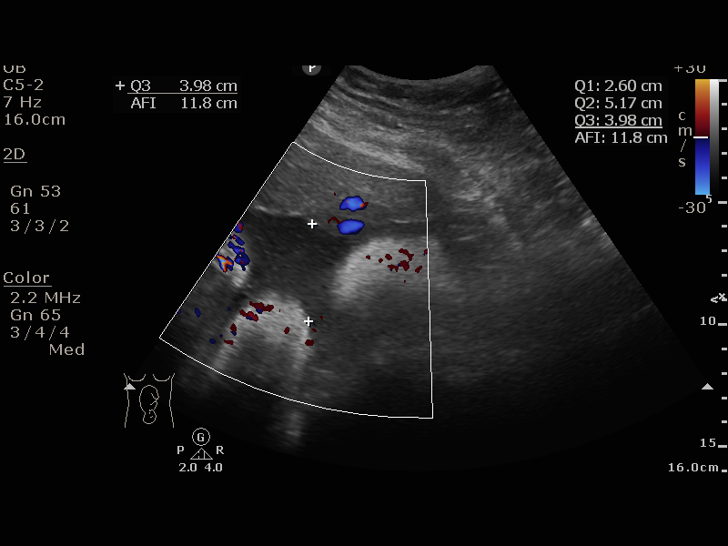
[im 5/12]
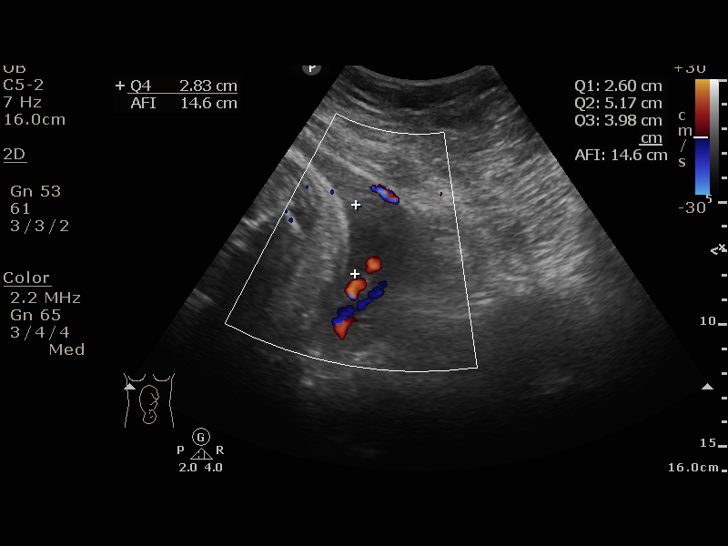
[im 6/12]
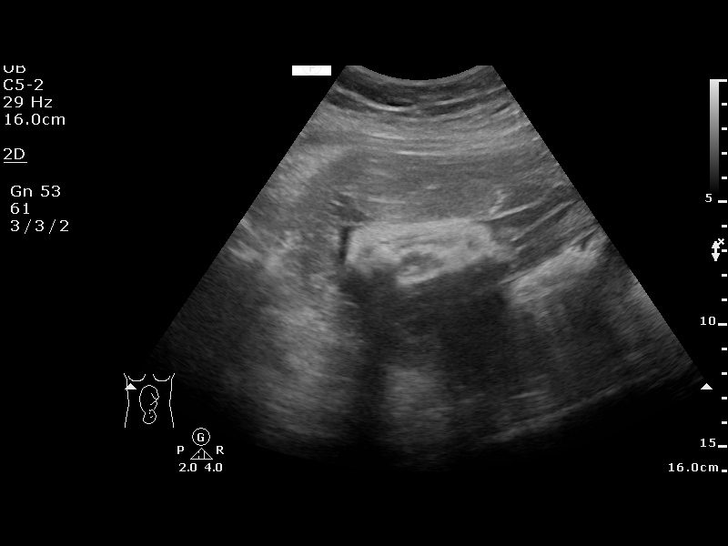
[im 7/12]
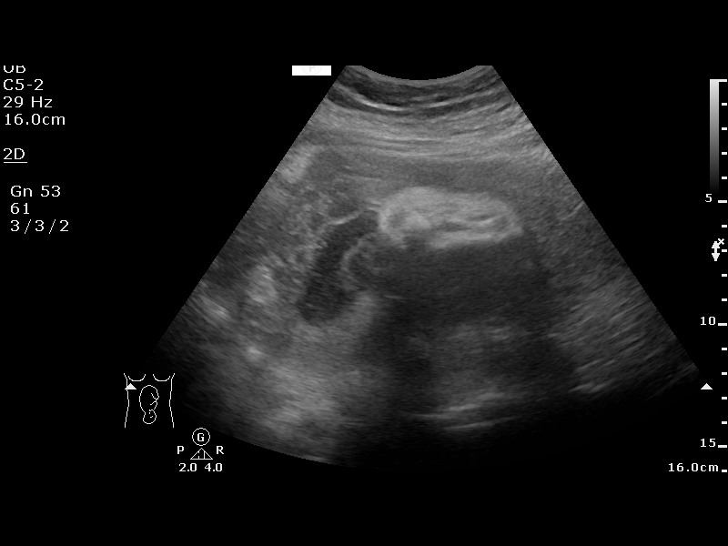
[im 8/12]
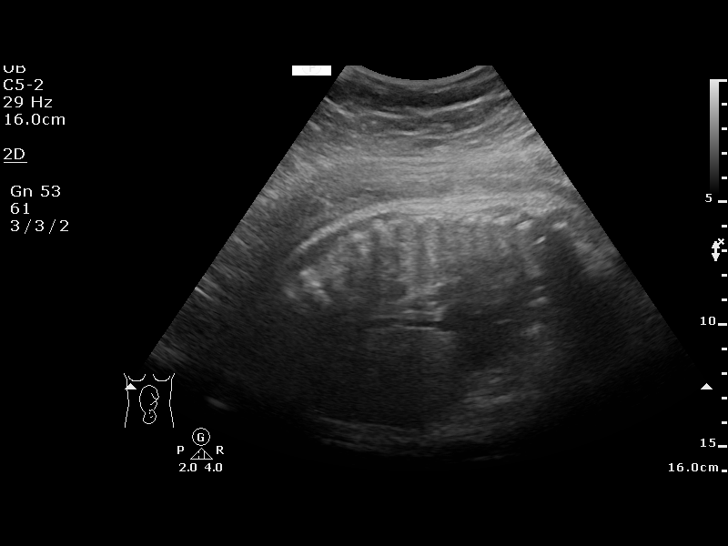
[im 9/12]
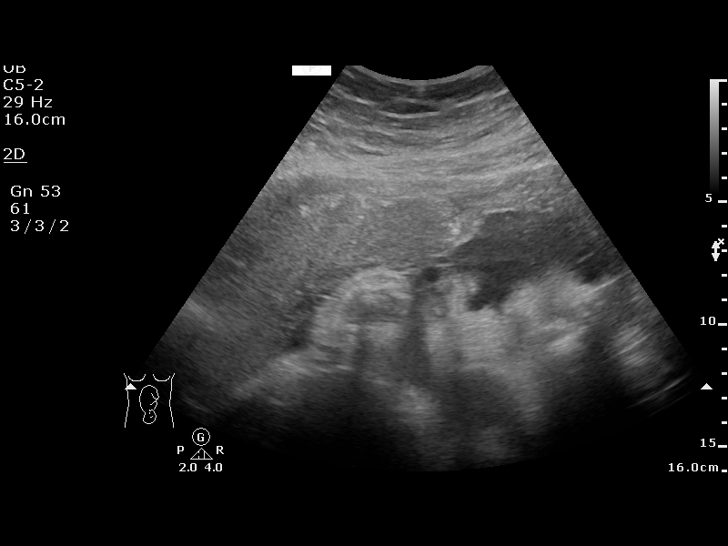
[im 10/12]
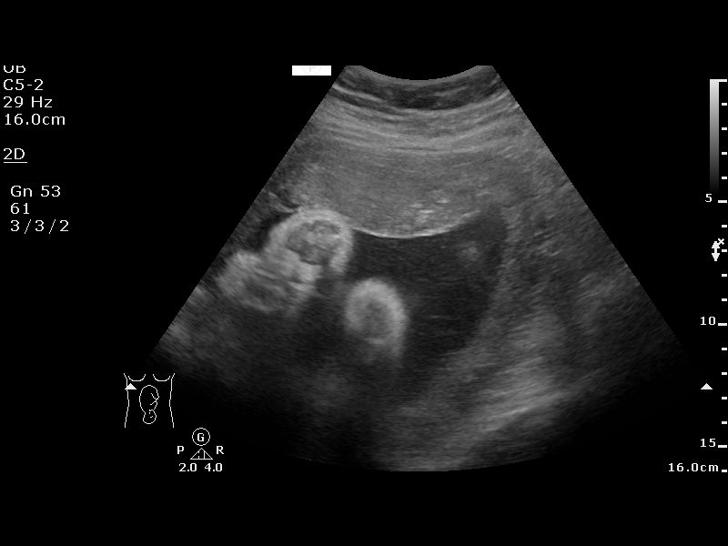
[im 11/12]
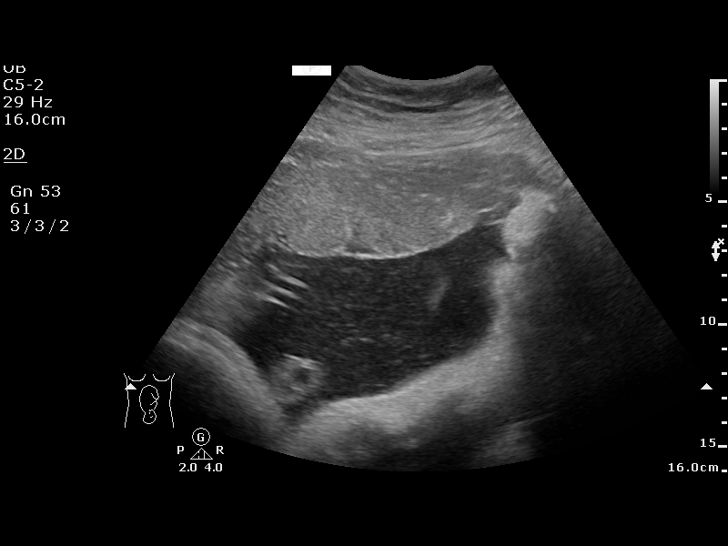
[im 12/12]
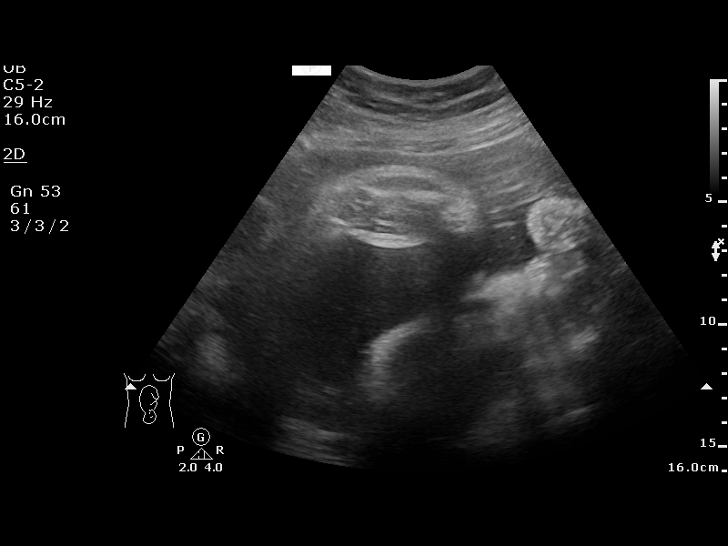

[12 of 12 positions shown; findings below may reference images not displayed]

[address]
                                                            Women's
                                                            [REDACTED]

  1  US FETAL BPP W/NONSTRESS             76818.4      [facility]
 ----------------------------------------------------------------------

 ----------------------------------------------------------------------
Service(s) Provided

 ----------------------------------------------------------------------
Indications

  37 weeks gestation of pregnancy
  Diabetes - Gestational
 ----------------------------------------------------------------------
Fetal Evaluation

 Num Of Fetuses:         1
 Preg. Location:         Intrauterine
 Cardiac Activity:       Observed
 Presentation:           Cephalic

 Amniotic Fluid
 AFI FV:      Within normal limits

 AFI Sum(cm)     %Tile       Largest Pocket(cm)
 14.58           54

 RUQ(cm)       RLQ(cm)       LUQ(cm)        LLQ(cm)


 Comment:    Breathing noted intermittently, but not sustained.
Biophysical Evaluation

 Amniotic F.V:   Pocket => 2 cm             F. Tone:        Observed
 F. Movement:    Observed                   N.S.T:          Reactive
 F. Breathing:   Not Observed               Score:          [DATE]
OB History
 Gravidity:    3         Term:   2        Prem:   0        SAB:   0
 TOP:          0       Ectopic:  0        Living: 2
Gestational Age

 LMP:           37w 0d        Date:  07/18/18                 EDD:   04/24/19
 Best:          37w 0d     Det. By:  LMP  (07/18/18)          EDD:   04/24/19
Impression

 BPP [DATE]
Recommendations

 -Continue weekly BPP till delivery.
                  Jetmar, Puchmeltr

## 2020-08-04 IMAGING — US US MFM OB FOLLOW-UP
1 series · 13 of 28 positions shown · non-contrast
Comparison: none

[Series 1: us mfm ob follow-up · 33 acquisitions, 13 frames shown]
[im 2/33]
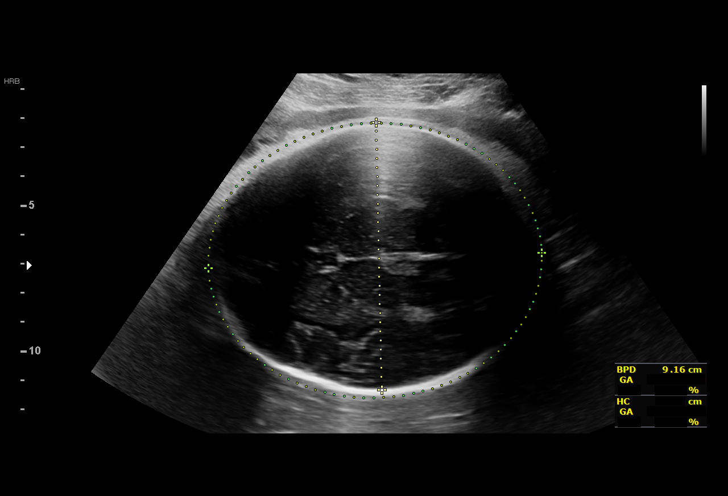
[im 4/33]
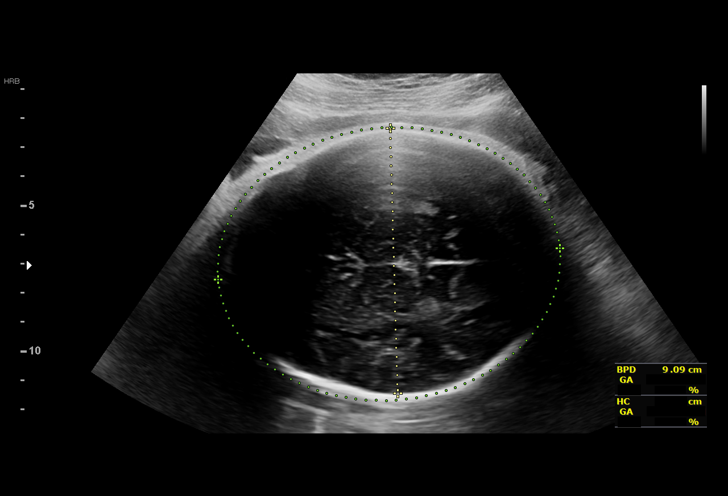
[im 6/33]
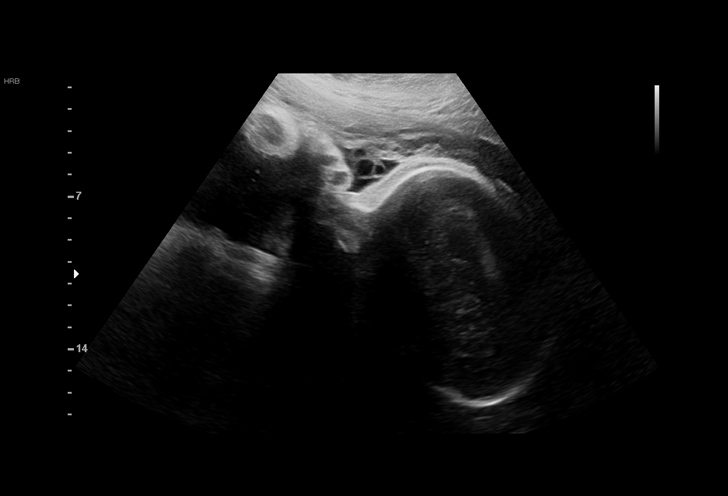
[im 9/33]
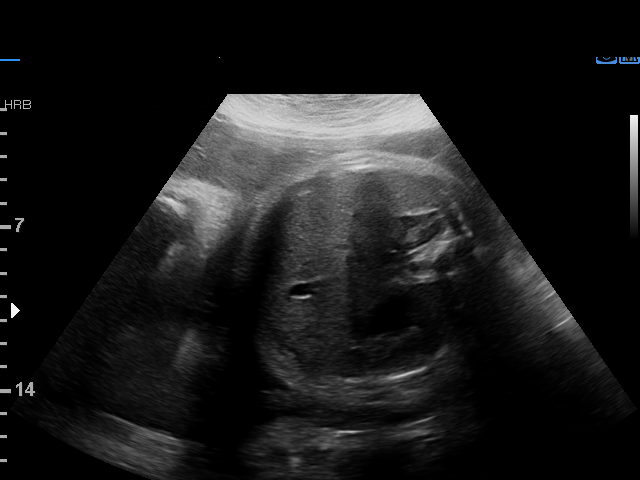
[im 11/33]
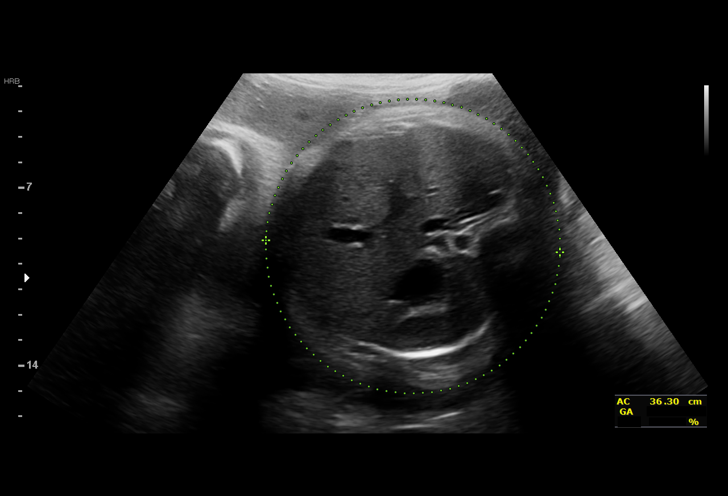
[im 14/33]
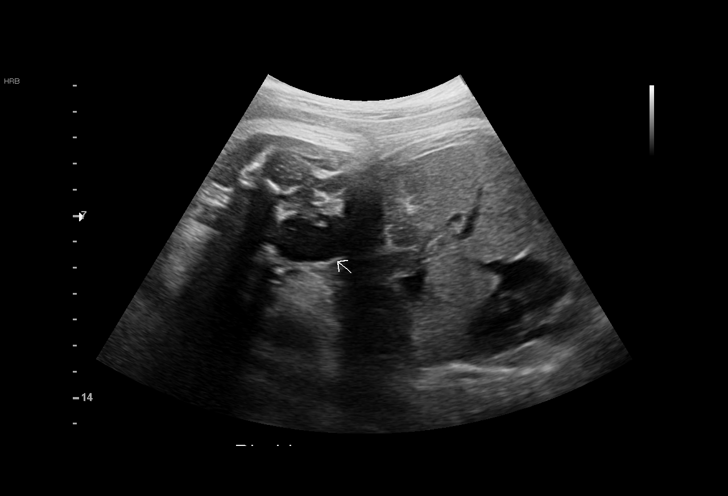
[im 17/33]
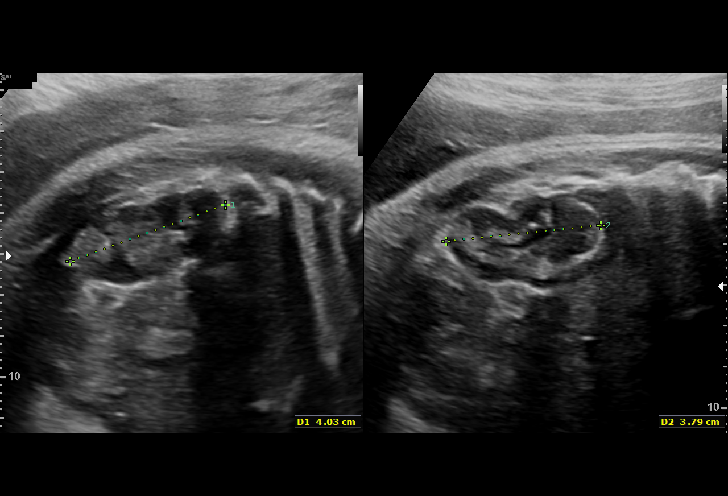
[im 19/33]
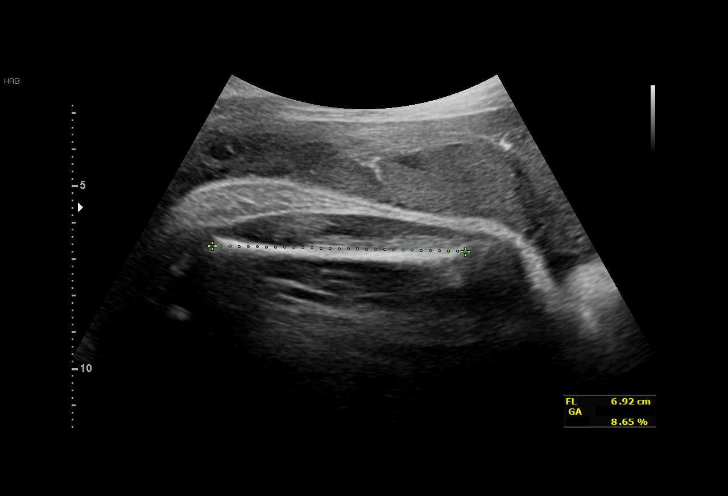
[im 22/33]
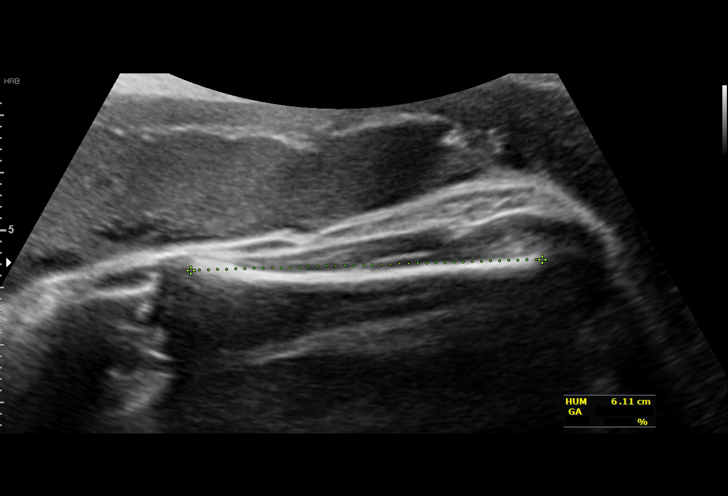
[im 24/33]
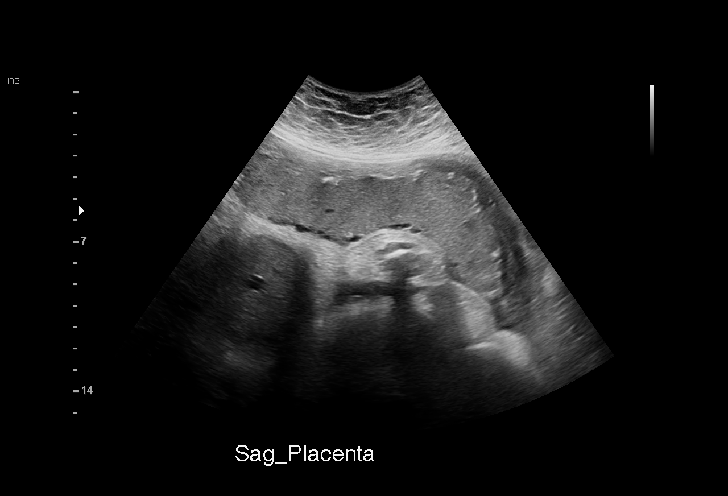
[im 27/33]
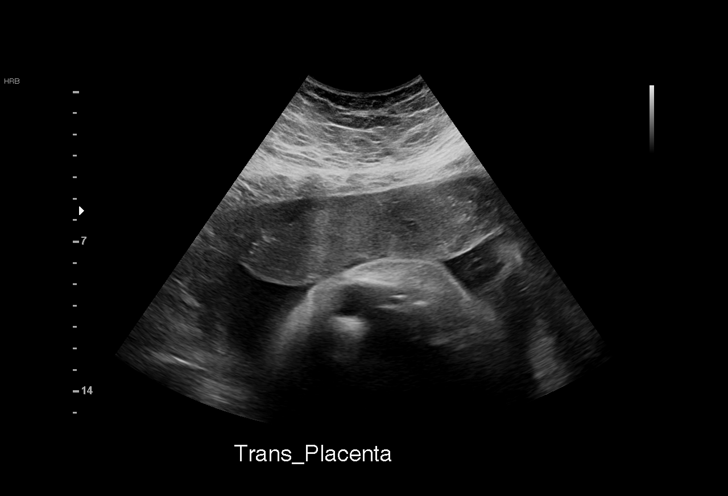
[im 29/33]
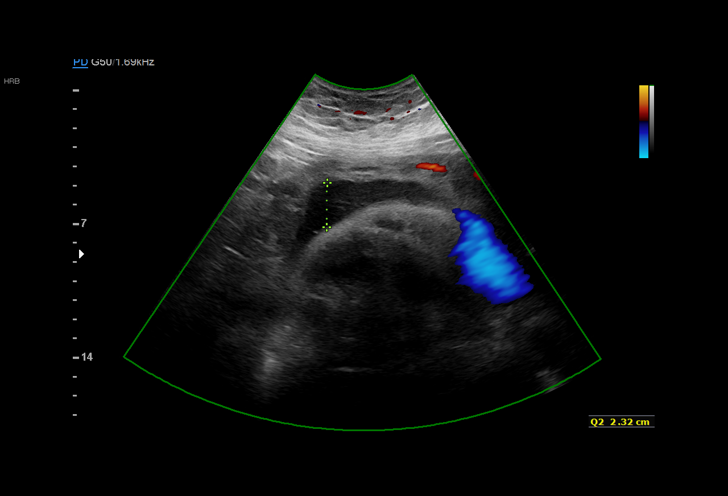
[im 31/33]
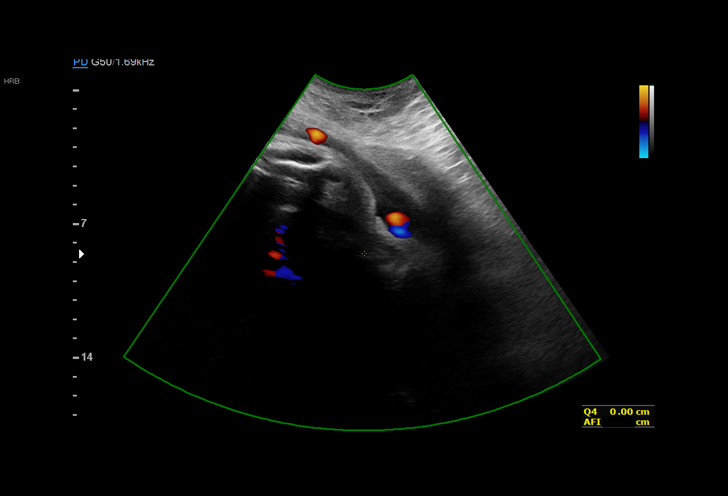

[13 of 28 positions shown; findings below may reference images not displayed]

[address]

 ----------------------------------------------------------------------

 ----------------------------------------------------------------------
Indications

  Diabetes - Gestational (no control, does not
  own a glucometer)
  Obesity complicating pregnancy, third
  trimester
  37 weeks gestation of pregnancy
  Late prenatal care, second trimester
  (initiated at 24 weeks)
  Antenatal screening for malformations
  Short interval between pregancies, 2nd
  trimester
 ----------------------------------------------------------------------
Vital Signs

 BMI:
Fetal Evaluation

 Num Of Fetuses:         1
 Fetal Heart Rate(bpm):  141
 Cardiac Activity:       Observed
 Presentation:           Cephalic
 Placenta:               Anterior
 P. Cord Insertion:      Previously Visualized

 Amniotic Fluid
 AFI FV:      Within normal limits

 AFI Sum(cm)     %Tile       Largest Pocket(cm)
 10.95           32

 RUQ(cm)       RLQ(cm)       LUQ(cm)        LLQ(cm)
 4.9           0
Biometry

 BPD:      91.4  mm     G. Age:  37w 1d         57  %    CI:        77.02   %    70 - 86
                                                         FL/HC:      21.0   %    20.9 -
 HC:      329.8  mm     G. Age:  37w 4d         26  %    HC/AC:      0.90        0.92 -
 AC:      364.6  mm     G. Age:  40w 3d       > 99  %    FL/BPD:     75.9   %    71 - 87
 FL:       69.4  mm     G. Age:  35w 4d          9  %    FL/AC:      19.0   %    20 - 24
 HUM:      61.1  mm     G. Age:  35w 3d         33  %

 LV:        2.8  mm

 Est. FW:    9666  gm    7 lb 13 oz      84  %
OB History

 Gravidity:    3         Term:   2        Prem:   0        SAB:   0
 TOP:          0       Ectopic:  0        Living: 2
Gestational Age

 LMP:           37w 4d        Date:  07/18/18                 EDD:   04/24/19
 U/S Today:     37w 5d                                        EDD:   04/23/19
 Best:          37w 4d     Det. By:  LMP  (07/18/18)          EDD:   04/24/19
Anatomy

 Cranium:               Appears normal         Aortic Arch:            Previously seen
 Cavum:                 Appears normal         Ductal Arch:            Previously seen
 Ventricles:            Appears normal         Diaphragm:              Appears normal
 Choroid Plexus:        Previously seen        Stomach:                Appears normal, left
                                                                       sided
 Cerebellum:            Previously seen        Abdomen:                Appears normal
 Posterior Fossa:       Previously seen        Abdominal Wall:         Previously seen
 Nuchal Fold:           Not applicable (>20    Cord Vessels:           Previously seen
                        wks GA)
 Face:                  Orbits and profile     Kidneys:                Appear normal
                        previously seen
 Lips:                  Previously seen        Bladder:                Appears normal
 Thoracic:              Appears normal         Spine:                  Previously seen
 Heart:                 Previously seen        Upper Extremities:      Previously seen
 RVOT:                  Previously seen        Lower Extremities:      Previously seen
 LVOT:                  Previously seen

 Other:  Heels and right hand previously visualized. Nasal bone previously
         visualized. 3VV and 3VTV previously visualized. Technically difficult
         due to maternal habitus and fetal position.
Cervix Uterus Adnexa

 Cervix
 Not visualized (advanced GA >53wks)
Comments

 This patient was seen for a follow up growth scan due to
 gestational diabetes and maternal obesity.  The patient
 reports that she has not been checking her fingerstick values
 as she does not have a glucometer.
 She was informed that the fetal growth and amniotic fluid
 level appears appropriate for her gestational age.
 As the patient essentially has uncontrolled gestational
 diabetes as she is not checking her fingerstick values, at her
 current gestational age delivery is recommended in order to
 avoid an adverse pregnancy outcome.  An induction of labor
 will be scheduled for the patient in the next few days.

## 2020-08-29 ENCOUNTER — Other Ambulatory Visit: Payer: Self-pay

## 2020-08-29 ENCOUNTER — Inpatient Hospital Stay (HOSPITAL_COMMUNITY)
Admission: AD | Admit: 2020-08-29 | Discharge: 2020-08-31 | DRG: 807 | Disposition: A | Discharge status: Home / Self Care | Payer: Medicaid Other | Attending: Obstetrics & Gynecology | Admitting: Obstetrics & Gynecology

## 2020-08-29 ENCOUNTER — Encounter (HOSPITAL_COMMUNITY): Payer: Self-pay | Admitting: Obstetrics & Gynecology

## 2020-08-29 DIAGNOSIS — O3663X0 Maternal care for excessive fetal growth, third trimester, not applicable or unspecified: Secondary | ICD-10-CM | POA: Diagnosis present

## 2020-08-29 DIAGNOSIS — D563 Thalassemia minor: Secondary | ICD-10-CM | POA: Diagnosis not present

## 2020-08-29 DIAGNOSIS — Z3480 Encounter for supervision of other normal pregnancy, unspecified trimester: Secondary | ICD-10-CM

## 2020-08-29 DIAGNOSIS — O9903 Anemia complicating the puerperium: Secondary | ICD-10-CM | POA: Diagnosis not present

## 2020-08-29 DIAGNOSIS — O2412 Pre-existing diabetes mellitus, type 2, in childbirth: Secondary | ICD-10-CM | POA: Diagnosis not present

## 2020-08-29 DIAGNOSIS — Z20822 Contact with and (suspected) exposure to covid-19: Secondary | ICD-10-CM | POA: Diagnosis present

## 2020-08-29 DIAGNOSIS — E119 Type 2 diabetes mellitus without complications: Secondary | ICD-10-CM | POA: Diagnosis not present

## 2020-08-29 DIAGNOSIS — Z7984 Long term (current) use of oral hypoglycemic drugs: Secondary | ICD-10-CM | POA: Diagnosis not present

## 2020-08-29 DIAGNOSIS — O99214 Obesity complicating childbirth: Secondary | ICD-10-CM | POA: Diagnosis present

## 2020-08-29 DIAGNOSIS — Z3490 Encounter for supervision of normal pregnancy, unspecified, unspecified trimester: Secondary | ICD-10-CM

## 2020-08-29 DIAGNOSIS — O9902 Anemia complicating childbirth: Secondary | ICD-10-CM | POA: Diagnosis present

## 2020-08-29 DIAGNOSIS — Z3A39 39 weeks gestation of pregnancy: Secondary | ICD-10-CM

## 2020-08-29 DIAGNOSIS — O2413 Pre-existing diabetes mellitus, type 2, in the puerperium: Secondary | ICD-10-CM | POA: Diagnosis not present

## 2020-08-29 DIAGNOSIS — D649 Anemia, unspecified: Secondary | ICD-10-CM | POA: Diagnosis not present

## 2020-08-29 DIAGNOSIS — O99824 Streptococcus B carrier state complicating childbirth: Secondary | ICD-10-CM | POA: Diagnosis present

## 2020-08-29 DIAGNOSIS — O0990 Supervision of high risk pregnancy, unspecified, unspecified trimester: Secondary | ICD-10-CM

## 2020-08-29 DIAGNOSIS — O26893 Other specified pregnancy related conditions, third trimester: Secondary | ICD-10-CM | POA: Diagnosis not present

## 2020-08-29 DIAGNOSIS — O0930 Supervision of pregnancy with insufficient antenatal care, unspecified trimester: Secondary | ICD-10-CM

## 2020-08-29 DIAGNOSIS — O99820 Streptococcus B carrier state complicating pregnancy: Secondary | ICD-10-CM

## 2020-08-29 LAB — GLUCOSE, CAPILLARY
Glucose-Capillary: 123 mg/dL — ABNORMAL HIGH (ref 70–99)
Glucose-Capillary: 138 mg/dL — ABNORMAL HIGH (ref 70–99)
Glucose-Capillary: 88 mg/dL (ref 70–99)
Glucose-Capillary: 72 mg/dL (ref 70–99)
Glucose-Capillary: 99 mg/dL (ref 70–99)

## 2020-08-29 LAB — HIV ANTIBODY (ROUTINE TESTING W REFLEX): HIV Screen 4th Generation wRfx: NONREACTIVE

## 2020-08-29 LAB — RESP PANEL BY RT-PCR (FLU A&B, COVID) ARPGX2
Influenza A by PCR: NEGATIVE
Influenza B by PCR: NEGATIVE
SARS Coronavirus 2 by RT PCR: NEGATIVE

## 2020-08-29 LAB — HEPATITIS B SURFACE ANTIGEN: Hepatitis B Surface Ag: NONREACTIVE

## 2020-08-29 LAB — COMPREHENSIVE METABOLIC PANEL
ALT: 13 U/L (ref 0–44)
AST: 21 U/L (ref 15–41)
Albumin: 2.7 g/dL — ABNORMAL LOW (ref 3.5–5.0)
Alkaline Phosphatase: 129 U/L — ABNORMAL HIGH (ref 38–126)
Anion gap: 10 (ref 5–15)
BUN: 8 mg/dL (ref 6–20)
CO2: 17 mmol/L — ABNORMAL LOW (ref 22–32)
Calcium: 8.8 mg/dL — ABNORMAL LOW (ref 8.9–10.3)
Chloride: 106 mmol/L (ref 98–111)
Creatinine, Ser: 0.76 mg/dL (ref 0.44–1.00)
GFR, Estimated: >60 mL/min (ref >60)
Glucose, Bld: 119 mg/dL — ABNORMAL HIGH (ref 70–99)
Potassium: 3.7 mmol/L (ref 3.5–5.1)
Sodium: 133 mmol/L — ABNORMAL LOW (ref 135–145)
Total Bilirubin: 0.8 mg/dL (ref 0.3–1.2)
Total Protein: 6.4 g/dL — ABNORMAL LOW (ref 6.5–8.1)

## 2020-08-29 LAB — RPR: RPR Ser Ql: NONREACTIVE

## 2020-08-29 LAB — CBC
HCT: 29.4 % — ABNORMAL LOW (ref 36.0–46.0)
Hemoglobin: 8.5 g/dL — ABNORMAL LOW (ref 12.0–15.0)
MCH: 18.3 pg — ABNORMAL LOW (ref 26.0–34.0)
MCHC: 28.9 g/dL — ABNORMAL LOW (ref 30.0–36.0)
MCV: 63.2 fL — ABNORMAL LOW (ref 80.0–100.0)
Platelets: 232 10*3/uL (ref 150–400)
RBC: 4.65 MIL/uL (ref 3.87–5.11)
RDW: 20.9 % — ABNORMAL HIGH (ref 11.5–15.5)
WBC: 7.1 10*3/uL (ref 4.0–10.5)
nRBC: 0 % (ref 0.0–0.2)

## 2020-08-29 LAB — HEMOGLOBIN A1C
Hgb A1c MFr Bld: 7 % — ABNORMAL HIGH (ref 4.8–5.6)
Mean Plasma Glucose: 154.2 mg/dL

## 2020-08-29 MED ORDER — OXYTOCIN BOLUS FROM INFUSION
333.0000 mL | Freq: Once | INTRAVENOUS | Status: AC
  Administered 2020-08-29: 333 mL via INTRAVENOUS

## 2020-08-29 MED ORDER — DIPHENHYDRAMINE HCL 25 MG PO CAPS: 25.0000 mg | ORAL_CAPSULE | Freq: Four times a day (QID) | ORAL | Status: DC | PRN

## 2020-08-29 MED ORDER — ONDANSETRON HCL 4 MG/2ML IJ SOLN: 4.0000 mg | Freq: Four times a day (QID) | INTRAMUSCULAR | Status: DC | PRN

## 2020-08-29 MED ORDER — INSULIN ASPART 100 UNIT/ML IJ SOLN: 0.0000 [IU] | Freq: Three times a day (TID) | INTRAMUSCULAR | Status: DC

## 2020-08-29 MED ORDER — ACETAMINOPHEN 325 MG PO TABS: 650.0000 mg | ORAL_TABLET | ORAL | Status: DC | PRN

## 2020-08-29 MED ORDER — TETANUS-DIPHTH-ACELL PERTUSSIS 5-2.5-18.5 LF-MCG/0.5 IM SUSY: 0.5000 mL | PREFILLED_SYRINGE | Freq: Once | INTRAMUSCULAR | Status: DC

## 2020-08-29 MED ORDER — WITCH HAZEL-GLYCERIN EX PADS: 1.0000 "application " | MEDICATED_PAD | CUTANEOUS | Status: DC | PRN

## 2020-08-29 MED ORDER — SENNOSIDES-DOCUSATE SODIUM 8.6-50 MG PO TABS
2.0000 | ORAL_TABLET | Freq: Every day | ORAL | Status: DC
  Administered 2020-08-30 – 2020-08-31 (×2): 2 via ORAL
  Filled 2020-08-29 (×2): qty 2

## 2020-08-29 MED ORDER — ONDANSETRON HCL 4 MG PO TABS: 4.0000 mg | ORAL_TABLET | ORAL | Status: DC | PRN

## 2020-08-29 MED ORDER — PRENATAL MULTIVITAMIN CH
1.0000 | ORAL_TABLET | Freq: Every day | ORAL | Status: DC
  Administered 2020-08-29 – 2020-08-31 (×3): 1 via ORAL
  Filled 2020-08-29 (×3): qty 1

## 2020-08-29 MED ORDER — IBUPROFEN 600 MG PO TABS
600.0000 mg | ORAL_TABLET | Freq: Four times a day (QID) | ORAL | Status: DC
  Administered 2020-08-29 – 2020-08-31 (×9): 600 mg via ORAL
  Filled 2020-08-29 (×9): qty 1

## 2020-08-29 MED ORDER — SODIUM CHLORIDE 0.9 % IV SOLN
500.0000 mg | Freq: Once | INTRAVENOUS | Status: AC
  Administered 2020-08-29: 500 mg via INTRAVENOUS
  Filled 2020-08-29: qty 25

## 2020-08-29 MED ORDER — DIBUCAINE (PERIANAL) 1 % EX OINT: 1.0000 "application " | TOPICAL_OINTMENT | CUTANEOUS | Status: DC | PRN

## 2020-08-29 MED ORDER — SOD CITRATE-CITRIC ACID 500-334 MG/5ML PO SOLN: 30.0000 mL | ORAL | Status: DC | PRN

## 2020-08-29 MED ORDER — INSULIN ASPART 100 UNIT/ML IJ SOLN: 0.0000 [IU] | Freq: Every day | INTRAMUSCULAR | Status: DC

## 2020-08-29 MED ORDER — LACTATED RINGERS IV SOLN: INTRAVENOUS | Status: DC

## 2020-08-29 MED ORDER — COCONUT OIL OIL: 1.0000 "application " | TOPICAL_OIL | Status: DC | PRN

## 2020-08-29 MED ORDER — OXYCODONE-ACETAMINOPHEN 5-325 MG PO TABS: 1.0000 | ORAL_TABLET | ORAL | Status: DC | PRN

## 2020-08-29 MED ORDER — BENZOCAINE-MENTHOL 20-0.5 % EX AERO: 1.0000 "application " | INHALATION_SPRAY | CUTANEOUS | Status: DC | PRN

## 2020-08-29 MED ORDER — SODIUM CHLORIDE 0.9 % IV SOLN: 2.0000 g | Freq: Once | INTRAVENOUS | Status: DC

## 2020-08-29 MED ORDER — SIMETHICONE 80 MG PO CHEW: 80.0000 mg | CHEWABLE_TABLET | ORAL | Status: DC | PRN

## 2020-08-29 MED ORDER — ONDANSETRON HCL 4 MG/2ML IJ SOLN: 4.0000 mg | INTRAMUSCULAR | Status: DC | PRN

## 2020-08-29 MED ORDER — LIDOCAINE HCL (PF) 1 % IJ SOLN: 30.0000 mL | INTRAMUSCULAR | Status: DC | PRN

## 2020-08-29 MED ORDER — OXYTOCIN-SODIUM CHLORIDE 30-0.9 UT/500ML-% IV SOLN
2.5000 [IU]/h | INTRAVENOUS | Status: DC
  Administered 2020-08-29: 2.5 [IU]/h via INTRAVENOUS
  Filled 2020-08-29: qty 500

## 2020-08-29 MED ORDER — OXYCODONE-ACETAMINOPHEN 5-325 MG PO TABS
2.0000 | ORAL_TABLET | ORAL | Status: DC | PRN
  Administered 2020-08-29: 2 via ORAL
  Filled 2020-08-29: qty 2

## 2020-08-29 MED ORDER — ZOLPIDEM TARTRATE 5 MG PO TABS: 5.0000 mg | ORAL_TABLET | Freq: Every evening | ORAL | Status: DC | PRN

## 2020-08-29 MED ORDER — SODIUM CHLORIDE 0.9 % IV SOLN: 1.0000 g | INTRAVENOUS | Status: DC

## 2020-08-29 MED ORDER — LACTATED RINGERS IV SOLN
500.0000 mL | INTRAVENOUS | Status: DC | PRN
Start: 2020-08-29 — End: 2020-08-29

## 2020-08-29 NOTE — Discharge Summary (Signed)
Postpartum Discharge Summary  Date of Service updated 08/31/20     Patient Name: Linda Torres DOB: 05/25/1995 MRN: [id number]  Date of admission: 08/29/2020 Delivery date:08/29/2020  Delivering provider: Renee Harder  Date of discharge:   Admitting diagnosis: Supervision of high risk pregnancy, antepartum [O09.90] Intrauterine pregnancy: [redacted]w[redacted]d    Secondary diagnosis:  Active Problems:   Supervision of low-risk pregnancy   Morbid obesity ([place]   Anemia   Alpha thalassemia silent carrier   GBS (group B Streptococcus carrier), +RV culture, currently pregnant   Limited prenatal care   SVD (spontaneous vaginal delivery)   Supervision of other normal pregnancy, antepartum   Supervision of high risk pregnancy, antepartum   Type 2 diabetes mellitus ([facility]  Additional problems: none    Discharge diagnosis: Term Pregnancy Delivered and Type 2 DM                                              Post partum procedures:IV venofer Augmentation: AROM Complications: None  Hospital course: Onset of Labor With Vaginal Delivery      25y.o. yo [phone] 352w3das admitted in Active Labor on 08/29/2020. Patient had an uncomplicated labor course as follows:  Membrane Rupture Time/Date: 7:30 AM ,08/29/2020   Delivery Method:Vaginal, Spontaneous  Episiotomy: None  Lacerations:  None  Patient had an uncomplicated postpartum course. She is ambulating, tolerating a regular diet, passing flatus, and urinating well. Patient is discharged home in stable condition on 08/31/20.  Newborn Data: Birth date:08/29/2020  Birth time:7:31 AM  Gender:Female  Living status:Living  Apgars:9 ,9  Weight:4355 g   Magnesium Sulfate received: No BMZ received: No Rhophylac:N/A MMR:N/A T-DaP:Given prenatally Flu: No Transfusion:No  Physical exam  Vitals:   08/30/20 0958 08/30/20 1053 08/30/20 2107 08/31/20 0441  BP: (!) 143/67 128/72 124/89 (!) 109/52  Pulse: 98 100 62 93  Resp:   18 18  Temp:    98.1 F (36.7 C) 97.9 F (36.6 C)  TempSrc:   Oral Oral  SpO2: 100%      General: alert, cooperative and no distress Lochia: appropriate Uterine Fundus: firm Incision: N/A DVT Evaluation: No evidence of DVT seen on physical exam. Labs: Lab Results  Component Value Date   WBC 10.9 (H) 08/31/2020   HGB 7.6 (L) 08/31/2020   HCT 26.6 (L) 08/31/2020   MCV 63.8 (L) 08/31/2020   PLT 252 08/31/2020   CMP Latest Ref Rng & Units 08/29/2020  Glucose 70 - 99 mg/dL 119(H)  BUN 6 - 20 mg/dL 8  Creatinine 0.44 - 1.00 mg/dL 0.76  Sodium 135 - 145 mmol/L 133(L)  Potassium 3.5 - 5.1 mmol/L 3.7  Chloride 98 - 111 mmol/L 106  CO2 22 - 32 mmol/L 17(L)  Calcium 8.9 - 10.3 mg/dL 8.8(L)  Total Protein 6.5 - 8.1 g/dL 6.4(L)  Total Bilirubin 0.3 - 1.2 mg/dL 0.8  Alkaline Phos 38 - 126 U/L 129(H)  AST 15 - 41 U/L 21  ALT 0 - 44 U/L 13   Edinburgh Score: Edinburgh Postnatal Depression Scale Screening Tool 08/30/2020  I have been able to laugh and see the funny side of things. (No Data)  I have looked forward with enjoyment to things. -  I have blamed myself unnecessarily when things went wrong. -  I have been anxious or worried for no good reason. -  I have felt scared or panicky for no good reason. -  Things have been getting on top of me. -  I have been so unhappy that I have had difficulty sleeping. -  I have felt sad or miserable. -  I have been so unhappy that I have been crying. -  The thought of harming myself has occurred to me. Flavia Shipper Postnatal Depression Scale Total -     After visit meds:  Allergies as of 08/31/2020   No Known Allergies     Medication List    TAKE these medications   acetaminophen 500 MG tablet Commonly known as: TYLENOL Take 2 tablets (1,000 mg total) by mouth every 6 (six) hours as needed. What changed:   medication strength  how much to take   coconut oil Oil Apply 1 application topically as needed.   ibuprofen 600 MG tablet Commonly known  as: ADVIL Take 1 tablet (600 mg total) by mouth every 6 (six) hours as needed.   metFORMIN 500 MG tablet Commonly known as: GLUCOPHAGE Take 2 tablets (1,000 mg total) by mouth 2 (two) times daily with a meal.   prenatal multivitamin Tabs tablet Take 1 tablet by mouth daily at 12 noon.        Discharge home in stable condition Infant Feeding: Breast Infant Disposition:home with mother Discharge instruction: per After Visit Summary and Postpartum booklet. Activity: Advance as tolerated. Pelvic rest for 6 weeks.  Diet: carb modified diet Future Appointments: Future Appointments  Date Time Provider Sebastian  10/10/2020  1:10 PM Gavin Pound, CNM [place] None   Follow up Visit:   Please schedule this patient for a In person postpartum visit in 6 weeks with the following provider: Any provider. Additional Postpartum F/U:Postpartum Depression checkup  High risk pregnancy complicated by: [id number] Delivery mode:  Vaginal, Spontaneous  Anticipated Birth Control:  Unsure   12/21/4095 Arrie Senate, MD

## 2020-08-29 NOTE — H&P (Signed)
OBSTETRIC ADMISSION HISTORY AND PHYSICAL  Linda Torres is a 25 y.o. female [phone] with IUP at [redacted]w[redacted]d by LMP presenting for SOL. Contractions started at 7pm. She reports +FMs, no LOF, no VB, no blurry vision, headaches or peripheral edema, and RUQ pain. She plans on breast feeding. She is undecided for birth control. She had one nurse visit at [facility].  Dating: By LMP --->  Estimated Date of Delivery: 09/02/20  Sono:    @[redacted]w[redacted]d , CWD, normal anatomy, breech presentation, 2167g, 90% EFW   Prenatal History/Complications: - No prenatal care - LGA at 31 weeks - T2DM, no medications  Past Medical History: Past Medical History:  Diagnosis Date  . Diabetes mellitus without complication (HCC)   . Morbid obesity (HCC)     Past Surgical History: Past Surgical History:  Procedure Laterality Date  . NO PAST SURGERIES      Obstetrical History: OB History    Gravida  4   Para  3   Term  3   Preterm      AB      Living  3     SAB      IAB      Ectopic      Multiple  0   Live Births  3           Social History Social History   Socioeconomic History  . Marital status: Married    Spouse name: Not on file  . Number of children: Not on file  . Years of education: Not on file  . Highest education level: Not on file  Occupational History  . Not on file  Tobacco Use  . Smoking status: Never Smoker  . Smokeless tobacco: Never Used  Vaping Use  . Vaping Use: Never used  Substance and Sexual Activity  . Alcohol use: Never  . Drug use: Never  . Sexual activity: Yes    Birth control/protection: None  Other Topics Concern  . Not on file  Social History Narrative  . Not on file   Social Determinants of Health   Financial Resource Strain: Not on file  Food Insecurity: Not on file  Transportation Needs: Not on file  Physical Activity: Not on file  Stress: Not on file  Social Connections: Not on file    Family History: Family History  Problem Relation Age  of Onset  . Diabetes Mother   . Depression Mother   . Thyroid disease Mother   . Depression Maternal Grandmother   . Diabetes Maternal Grandmother   . Thyroid disease Maternal Grandmother     Allergies: No Known Allergies  Medications Prior to Admission  Medication Sig Dispense Refill Last Dose  . Prenatal Vit-Fe Fumarate-FA (PRENATAL MULTIVITAMIN) TABS tablet Take 1 tablet by mouth daily at 12 noon.   08/28/2020 at Unknown time  . acetaminophen (TYLENOL) 325 MG tablet Take 2 tablets (650 mg total) by mouth every 6 (six) hours as needed. (Patient not taking: Reported on 06/20/2020) 30 tablet 0      Review of Systems   All systems reviewed and negative except as stated in HPI  Blood pressure (!) 141/82, pulse (!) 115, temperature 98.4 F (36.9 C), temperature source Oral, resp. rate 19, last menstrual period 11/27/2019, SpO2 100 %, unknown if currently breastfeeding. General appearance: alert, moderate distress and morbidly obese Lungs: clear to auscultation bilaterally Heart: regular rate and rhythm Abdomen: soft, non-tender; bowel sounds normal Pelvic: adequate Extremities: Homans sign is negative, no sign of DVT  DTR's negative Presentation: cephalic Fetal monitoringFHR 155, minimal/moderate variability, +10x10, intermittent variable decelerations Uterine activity: Q2-23mins Dilation: 9 Effacement (%): 90 Station: -1 Exam by:: Lysle Dingwall, CNM   Prenatal labs: ABO, Rh: --/--/O POS (05/12 0610) Antibody: NEG (05/12 0610) Rubella: pending RPR: pending  HBsAg: pending  HIV: pending  GBS: unknown  1 hr Glucola: did not do, A1C 7.0  Genetic screening: did not do Anatomy US: normal but limited due to gestational age, EFW 90%tile  Prenatal Transfer Tool  Maternal Diabetes: Yes:  Diabetes Type:  Pre-pregnancy Genetic Screening: Not done  Maternal Ultrasounds/Referrals: Normal Fetal Ultrasounds or other Referrals:  None Maternal Substance Abuse:  No Significant Maternal  Medications:  None Significant Maternal Lab Results: None  No results found for this or any previous visit (from the past 24 hour(s)).  Patient Active Problem List   Diagnosis Date Noted  . Supervision of high risk pregnancy, antepartum 08/29/2020  . Supervision of other normal pregnancy, antepartum 07/03/2020  . Gestational diabetes 04/08/2019  . Limited prenatal care 04/08/2019  . SVD (spontaneous vaginal delivery) 04/08/2019  . GBS (group B Streptococcus carrier), +RV culture, currently pregnant 04/06/2019  . Alpha thalassemia silent carrier 02/06/2019  . GDM (gestational diabetes mellitus) 01/20/2019  . Anemia 01/19/2019  . Unwanted fertility 01/19/2019  . Supervision of low-risk pregnancy 01/16/2019  . Morbid obesity (HCC)     Assessment/Plan:  Linda Torres is a 25 y.o. G4P3003 at [redacted]w[redacted]d here for SOL.  #Labor: expectant management #Pain: Epidural prn #FWB: Cat 2 tracing #ID: GBS unknown, given term and no risk factors will not treat #MOF: Breast #MOC: Undecided #Circ: N/A #T2DM: No meds, CBG on admission 123. Plan fasting CBG PP.  Brand Males, CNM  08/29/2020, 6:11 AM

## 2020-08-29 NOTE — Lactation Note (Signed)
This note was copied from a baby's chart. Lactation Consultation Note  Patient Name: Girl Treniya Lobb TZGYF'V Date: 08/29/2020 Reason for consult: Initial assessment;Term Age:27 hours   P4 mother whose infant is now 2 hours old.  This is a term baby at 39+3 weeks.  Mother did not breast feed her first child (now 27 years old) but did breast feed her other two children (now 27 and 5 year old)  Mother breast fed the youngest for 15 months.    Baby was STS on mother's chest when I arrived.  Reviewed breast feeding basics and allowed time for mother to ask questions.  Mother is familiar with hand expression and will practice before/after feedings to help ensure a full milk supply.  Suggested she call her RN/LC for latch assistance as needed.  Mother will feed 8-12 times/24 hours or sooner if baby shows feeding cues.  Discussed finger feeding/spoon feeding any EBM she obtains to baby.  Spoon at bedside.    Mother is not currently a [facility] participant, however, she does receive Medicaid.  I suggested she call the local [facility] office to determine eligibility for [facility] and to possibly obtain a DEBP if desired.  Mother will plan to do this.  Mom made aware of O/P services, breastfeeding support groups, community resources, and our phone # for post-discharge questions.  Visitor present.   Maternal Data Has patient been taught Hand Expression?: Yes Does the patient have breastfeeding experience prior to this delivery?: Yes How long did the patient breastfeed?: Breast fed her last two children with the youngest feeding for 15 months  Feeding Mother's Current Feeding Choice: Breast Milk  LATCH Score                    Lactation Tools Discussed/Used    Interventions    Discharge WIC Program: No (May qualify; she is currently on Medicaid)  Consult Status Consult Status: Follow-up Date: 08/30/20 Follow-up type: In-patient    Dora Sims 08/29/2020, 4:17 PM

## 2020-08-29 NOTE — MAU Note (Signed)
Pt presents with complaint of contractions, reports good fetal movement. No prenatal care

## 2020-08-29 NOTE — Lactation Note (Addendum)
This note was copied from a baby's chart. Lactation Consultation Note  Patient Name: Linda Torres EZMOQ'H Date: 08/29/2020 Reason for consult: Follow-up assessment;Mother's request;Difficult latch;Term;Maternal endocrine disorder;Other (Comment) (Hypoglycemia) Age:27 hours  Infant has low glucose. Mom state infant fall asleep at the breasts. LC got Mom set up on DEBP checking flange size, she states is comfortable at 24.   Mom's nipples are erect with no signs of trauma.  Mom using manual pump but only able to get small volumes with pumping.  LC talked with Mom about supplementing with DBM. Mom refused DBM and stated she would prefer to use formula.  LC alerted RN, Linda Torres, mother's preference for formula.   After pumping Mom received 20 ml of EBM. Infant fed 10 ml and latched with signs of milk transfer with breast compression. Mom will supplement with rest of EBM before end of the feeding.   Plan 1. To feed based on cues 8-12x in 24 hrs no more than 4 hrs without an attempt. Mom to offer both breasts and look for swallows with breast compression.           2. Mom to offer any EBM according to breastfeeding supplementation guide with paced bottle feeding with yellow slow flow nipple.            3 Mom to pump q 3hrs for 15 minutes   Mom denied any pain with latching.  RN to provided coconut oil for nipple care.  Mom will continue to just offer EBM since volumes from pumping was enough to meet infants needs.   Maternal Data Has patient been taught Hand Expression?: Yes Does the patient have breastfeeding experience prior to this delivery?: Yes How long did the patient breastfeed?: Breast fed her last two children with the youngest feeding for 15 months  Feeding Mother's Current Feeding Choice: Breast Milk Nipple Type: Slow - flow  LATCH Score                    Lactation Tools Discussed/Used Tools: Pump;Flanges Flange Size: 24 Breast pump type:  Double-Electric Breast Pump Pump Education: Setup, frequency, and cleaning;Milk Storage Reason for Pumping: increase stimulation Pumping frequency: every 3 hrs for 15 minutes  Interventions Interventions: Breast feeding basics reviewed;Support pillows;Education;Skin to skin;Expressed milk;Breast massage;Pre-pump if needed;DEBP  Discharge [facility] Program: No (May qualify; she is currently on Medicaid)  Consult Status Consult Status: Follow-up Date: 08/30/20 Follow-up type: In-patient    Linda Torres  Linda Torres 08/29/2020, 8:14 PM

## 2020-08-29 NOTE — Progress Notes (Incomplete)
Patient refused labs at this time. Will collect after delivery.   Ulis Rias, RN

## 2020-08-30 DIAGNOSIS — E119 Type 2 diabetes mellitus without complications: Secondary | ICD-10-CM | POA: Diagnosis not present

## 2020-08-30 DIAGNOSIS — O2413 Pre-existing diabetes mellitus, type 2, in the puerperium: Secondary | ICD-10-CM | POA: Diagnosis not present

## 2020-08-30 DIAGNOSIS — O9903 Anemia complicating the puerperium: Secondary | ICD-10-CM | POA: Diagnosis not present

## 2020-08-30 DIAGNOSIS — D649 Anemia, unspecified: Secondary | ICD-10-CM | POA: Diagnosis not present

## 2020-08-30 LAB — CBC
HCT: 26.7 % — ABNORMAL LOW (ref 36.0–46.0)
HCT: 27.6 % — ABNORMAL LOW (ref 36.0–46.0)
Hemoglobin: 7.7 g/dL — ABNORMAL LOW (ref 12.0–15.0)
Hemoglobin: 7.9 g/dL — ABNORMAL LOW (ref 12.0–15.0)
MCH: 18.4 pg — ABNORMAL LOW (ref 26.0–34.0)
MCH: 18.1 pg — ABNORMAL LOW (ref 26.0–34.0)
MCHC: 28.8 g/dL — ABNORMAL LOW (ref 30.0–36.0)
MCHC: 28.6 g/dL — ABNORMAL LOW (ref 30.0–36.0)
MCV: 63.9 fL — ABNORMAL LOW (ref 80.0–100.0)
MCV: 63.3 fL — ABNORMAL LOW (ref 80.0–100.0)
Platelets: 213 10*3/uL (ref 150–400)
Platelets: 231 10*3/uL (ref 150–400)
RBC: 4.18 MIL/uL (ref 3.87–5.11)
RBC: 4.36 MIL/uL (ref 3.87–5.11)
RDW: 20.8 % — ABNORMAL HIGH (ref 11.5–15.5)
RDW: 20.8 % — ABNORMAL HIGH (ref 11.5–15.5)
WBC: 9.9 10*3/uL (ref 4.0–10.5)
WBC: 10.5 10*3/uL (ref 4.0–10.5)
nRBC: 0.2 % (ref 0.0–0.2)
nRBC: 0.3 % — ABNORMAL HIGH (ref 0.0–0.2)

## 2020-08-30 LAB — RUBELLA SCREEN: Rubella: 3.69 index (ref >0.99)

## 2020-08-30 LAB — PREPARE RBC (CROSSMATCH)

## 2020-08-30 LAB — GLUCOSE, CAPILLARY
Glucose-Capillary: 71 mg/dL (ref 70–99)
Glucose-Capillary: 84 mg/dL (ref 70–99)

## 2020-08-30 MED ORDER — SODIUM CHLORIDE 0.9% IV SOLUTION
Freq: Once | INTRAVENOUS | Status: DC
Start: 2020-08-30 — End: 2020-08-31

## 2020-08-30 MED ORDER — SODIUM CHLORIDE 0.9% IV SOLUTION: Freq: Once | INTRAVENOUS | Status: DC

## 2020-08-30 NOTE — Lactation Note (Addendum)
This note was copied from a baby's chart. Lactation Consultation Note  Patient Name: Girl Zabella Wease WCHEN'I Date: 08/30/2020 Reason for consult: Follow-up assessment;Term;Infant weight loss (LGA infant with -3% weight loss.) Age:27 hours P4, term female infant. LC discussed with mom to record infant's input and output in addition document how long infant breastfeeds. Mom latched infant on her right breast using cradle side position due to mom having large breast and nipples, swallows observed, infant breastfeed for 17 minutes. Mom pumped opposite breast while breastfeeding infant and infant was given 25 mls of mom's EBM.  Mom knows to breastfeed infant according to cues, 8 to 12+ or more times within 24 hours, STS.  LC Knows to call RN or LC if she has questions, concerns or need assistance with latching infant at the breast. Mom will talk with medicaid about getting a DEBP and she will think about applying for the [facility] program. Mom does have hand pump to take home and LC reviewed with mom how to use. LC reviewed infant's input and output with mom. Maternal Data    Feeding Mother's Current Feeding Choice: Breast Milk Nipple Type: Slow - flow  LATCH Score Latch: Grasps breast easily, tongue down, lips flanged, rhythmical sucking.  Audible Swallowing: Spontaneous and intermittent  Type of Nipple: Everted at rest and after stimulation  Comfort (Breast/Nipple): Soft / non-tender  Hold (Positioning): Assistance needed to correctly position infant at breast and maintain latch.  LATCH Score: 9   Lactation Tools Discussed/Used    Interventions Interventions: Assisted with latch;Breast compression;Adjust position;Support pillows;Position options  Discharge    Consult Status Consult Status: Follow-up Date: 08/31/20 Follow-up type: In-patient    Danelle Earthly 08/30/2020, 4:43 PM

## 2020-08-30 NOTE — Progress Notes (Addendum)
POSTPARTUM PROGRESS NOTE  Post Partum Day 1  Subjective:  Linda Torres is a 25 y.o. [id number] s/p VD at [redacted]w[redacted]d. She reports she is doing well. No acute events overnight. She denies any problems with ambulating, voiding or po intake. Denies nausea or vomiting.  Pain is moderately controlled, having 7/10 cramping/contraction pain when breastfeeding. Lochia is minimal.   Objective: Blood pressure (!) 120/58, pulse 91, temperature 97.6 F (36.4 C), temperature source Oral, resp. rate 18, last menstrual period 11/27/2019, SpO2 100 %,  currently breastfeeding.  Physical Exam:  General: alert, cooperative and no distress Chest: no respiratory distress Heart:regular rate, distal pulses intact Abdomen: soft, nontender,  Uterine Fundus: firm, appropriately tender DVT Evaluation: No calf swelling or tenderness Skin: warm, dry  Recent Labs    08/29/20 0610 08/30/20 0501  HGB 8.5* 7.7*  HCT 29.4* 26.7*    Results for orders placed or performed during the hospital encounter of 08/29/20 (from the past 24 hour(s))  Glucose, capillary     Status: Abnormal   Collection Time: 08/29/20  9:56 AM  Result Value Ref Range   Glucose-Capillary 138 (H) 70 - 99 mg/dL  Glucose, capillary     Status: None   Collection Time: 08/29/20  2:27 PM  Result Value Ref Range   Glucose-Capillary 88 70 - 99 mg/dL  Glucose, capillary     Status: None   Collection Time: 08/29/20  7:21 PM  Result Value Ref Range   Glucose-Capillary 72 70 - 99 mg/dL  Glucose, capillary     Status: None   Collection Time: 08/29/20 11:12 PM  Result Value Ref Range   Glucose-Capillary 99 70 - 99 mg/dL   Lab Results  Component Value Date   HGBA1C 7.0 (H) 08/29/2020   Lab Results  Component Value Date   CREATININE 0.76 08/29/2020    Assessment/Plan: Linda Torres is a 25 y.o. [id number] s/p VD at [redacted]w[redacted]d with relevant history of limited PNC (completed intake but missed appt), T2DM, anemia, and elevated BP without dx.  #PPD#1:  Doing okay, general pain responding to Tylenol and Ibuprofen but pain associated with BF is not. Reports ice pack is uncomfortable.  Routine postpartum care #Contraception: Undecided, counseled on available options #Feeding: Breastfeeding going well  #T2DM: EFW 90%tile, HgbA1C 7.0, Rx sliding scale Novolog yesterday. 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 Plan to start oral iron today  #Limited PNC: SW consult placed. Need to f/u on whether pre-natal labs were drawn   Dispo: Plan for discharge tomorrow, 5/14    LOS: 1 day   Attestation of Supervision of Student:  I confirm that I have verified the information documented in the medical student's note and that I have also personally reperformed the history, physical exam and all medical decision making activities.  I have verified that all services and findings are accurately documented in this student's note; and I agree with management and plan as outlined in the documentation. I have also made any necessary editorial changes.  -Blood sugar today is 71 in the am, prior to eating breakfast -HgbA1c is a 7.0> Type2 DM -Hgb is 7.7, she is s/p venofer yesterday. Patient is asymptomatic right now for hypotension, although she has not been ambulating and out of bed. RN will notify CNM if patient becomes symtomatic and then will do blood transfusion, per Dr. Alysia Penna.  -Plan for discharge tomorrow.   Marylene Land, CNM [facility] [employer] 08/30/2020 11:30 AM

## 2020-08-30 NOTE — Progress Notes (Addendum)
Called and talked to nurse, patient felt dizzy walking around the room and has not done a lot of ambulating today. Will transfuse two units of PRBCs; RN made aware.   Linda Torres

## 2020-08-30 NOTE — Clinical Social Work Maternal (Signed)
CLINICAL SOCIAL WORK MATERNAL/CHILD NOTE  Patient Details  Name: Linda Torres MRN: [id number] Date of Birth: 12/14/1995  Date:  08/30/2020  Clinical Social Worker Initiating Note:  Linda Torres, MSW, [place] Date/Time: Initiated:  08/30/20/0900     Child's Name:  Linda Torres   Biological Parents:  Mother,Father Linda Torres)   Need for Interpreter:  None   Reason for Referral:  Late or No Prenatal Care    Address:  [place]  [address]    Phone number:  [phone] (home)     Additional phone number:   Household Members/Support Persons (HM/SP):   Household Member/Support Person 1,Household Member/Support Person 5,Household Member/Support Person 2,Household Member/Support Person 4,Household Member/Support Person 3   HM/SP Name Relationship DOB or Age  HM/SP -1 Linda Torres 02/20/1994  HM/SP -2 Linda Torres Sister 24  HM/SP -3 Linda Torres 04/08/1999  HM/SP -4 Linda Torres Daughter 01/25/2018  HM/SP -5 Linda Torres Daughter 11/14/2015  HM/SP -6        HM/SP -7        HM/SP -8          Natural Supports (not living in the home):  Immediate Family,Spouse/significant other   Professional Supports: None   Employment: Unemployed   Type of Work:     Education:  Programmer, systems   Homebound arranged:    Museum/gallery curator Resources:  Medicaid   Other Resources:  Physicist, medical    Cultural/Religious Considerations Which May Impact Care:    Strengths:  Ability to meet basic needs ,Pediatrician chosen,Home prepared for child    Psychotropic Medications:         Pediatrician:    Solicitor area  Pediatrician List:   Linda Torres Other ([facility])  [place]      Pediatrician Fax Number:    Risk Factors/Current Problems:  Estate manager/land agent:  Alert ,Goal Oriented ,Linear Thinking     Mood/Affect:  Interested ,Relaxed ,Comfortable    CSW Assessment: CSW consulted for Linda Torres. CSW met with [name] to complete assessment and offer support. CSW observed [name] changing infant and [name] sister Linda Torres also present. [name] declined to have her sister leave the room for assessment. [name] was receptive and open with CSW throughout the visit. CSW informed [name] of reason for consult. [name] reported she received LPNC due to "mostly transportation challenges." [name] shared she also did not feel comfortable with the practice, due to having a difficult time with scheduling an appointment. CSW informed [name] of Medicaid Transportation and provided her with contact information. CSW also informed [name] of [facility] and had her complete a ride waiver. [name] was informed of the hospital drug screen policy due to Linda Torres. [name] aware infant's UDS is negative and the CDS will be followed. [name] expressed understanding and denies any substance use or CPS history.   CSW assessed [name] currently mood. [name] reported she is feeling tired and a little overwhelmed. [name] disclosed she has experienced symptoms of depression and anxiousness her entire life. CSW asked [name] how the symptoms present. [name] stated she can isolate at times. [name] reported she has not identified any coping mechanisms, however she has been doing more research on mental health in general. CSW discussed different coping mechanisms with [name]. CSW asked [name] if she would ever consider therapy. [name] stated she would consider therapy  and was receptive to mental health resources provided by CSW. [name] inquired on if any agencies diagnose for autism. [name] disclosed her daughter has been diagnosed with autism and she sees some of the same characteristics in herself. CSW encouraged [name] to reach out to the agencies provided to inquire on what services can be provided to meet her needs. CSW asked [name] if she has ever been diagnosed with anything throughout her life. [name] reported she has  never been diagnosed with a form of autism, but she recognizes it can be genetic. [name] denies currently experiencing any mental health symptoms. [name] identified FOB and her sister as supports. [name] denies any current SI, HI or being involved in DV.   CSW provided education regarding the baby blues period versus PPD and provided resources. CSW provided the New Mom Checklist and encouraged [name] to self evaluate and contact a medical professional if symptoms are noted at any time.  CSW provided review of Sudden Infant Death Syndrome (SIDS) precautions. [name] reported she has all essentials for infant, including a crib and car seat. CSW established [facility] for infant's follow-up appointment on Tuesday at 9:30am. [name] was provided with pick-up information. [name] aware of additional transportation resources if needed. [name] also provided with information to apply for [facility] if interested. [name] expressed no additional needs at this time.   CSW will continue to follow CDS and make a CPS report if warranted. CSW identifies no further need for intervention and no barriers to discharge at this time.  CSW Plan/Description:  Hospital Drug Screen Policy Information,Other Patient/Family Education,Perinatal Mood and Anxiety Disorder (PMADs) Education,CSW Will Continue to Monitor Umbilical Cord Tissue Drug Screen Results and Make Report if Warranted,Child Protective Service Report ,No Further Intervention Required/No Barriers to Discharge,Sudden Infant Death Syndrome (SIDS) Education,Other Information/Referral to [employer], [place] 08/30/2020, 10:45 AM

## 2020-08-31 ENCOUNTER — Other Ambulatory Visit: Payer: Self-pay | Admitting: Obstetrics and Gynecology

## 2020-08-31 DIAGNOSIS — E119 Type 2 diabetes mellitus without complications: Secondary | ICD-10-CM | POA: Diagnosis not present

## 2020-08-31 DIAGNOSIS — O2413 Pre-existing diabetes mellitus, type 2, in the puerperium: Secondary | ICD-10-CM | POA: Diagnosis not present

## 2020-08-31 DIAGNOSIS — D649 Anemia, unspecified: Secondary | ICD-10-CM | POA: Diagnosis not present

## 2020-08-31 DIAGNOSIS — O9903 Anemia complicating the puerperium: Secondary | ICD-10-CM | POA: Diagnosis not present

## 2020-08-31 LAB — CBC
HCT: 26.6 % — ABNORMAL LOW (ref 36.0–46.0)
Hemoglobin: 7.6 g/dL — ABNORMAL LOW (ref 12.0–15.0)
MCH: 18.2 pg — ABNORMAL LOW (ref 26.0–34.0)
MCHC: 28.6 g/dL — ABNORMAL LOW (ref 30.0–36.0)
MCV: 63.8 fL — ABNORMAL LOW (ref 80.0–100.0)
Platelets: 252 10*3/uL (ref 150–400)
RBC: 4.17 MIL/uL (ref 3.87–5.11)
RDW: 20.8 % — ABNORMAL HIGH (ref 11.5–15.5)
WBC: 10.9 10*3/uL — ABNORMAL HIGH (ref 4.0–10.5)
nRBC: 0.2 % (ref 0.0–0.2)

## 2020-08-31 LAB — GLUCOSE, CAPILLARY
Glucose-Capillary: 103 mg/dL — ABNORMAL HIGH (ref 70–99)
Glucose-Capillary: 107 mg/dL — ABNORMAL HIGH (ref 70–99)

## 2020-08-31 MED ORDER — ACETAMINOPHEN 500 MG PO TABS: 1000.0000 mg | ORAL_TABLET | Freq: Four times a day (QID) | ORAL | Status: DC | PRN

## 2020-08-31 MED ORDER — ACCU-CHEK GUIDE W/DEVICE KIT: 1.0000 | PACK | Freq: Four times a day (QID) | 0 refills | Status: DC

## 2020-08-31 MED ORDER — FERROUS SULFATE 325 (65 FE) MG PO TABS
325.0000 mg | ORAL_TABLET | ORAL | 3 refills | Status: DC
Start: 2020-08-31 — End: 2022-10-21

## 2020-08-31 MED ORDER — ACCU-CHEK SOFTCLIX LANCETS MISC: 12 refills | Status: DC

## 2020-08-31 MED ORDER — ASCORBIC ACID 250 MG PO TABS: 250.0000 mg | ORAL_TABLET | ORAL | Status: DC

## 2020-08-31 MED ORDER — ASCORBIC ACID 500 MG PO TABS
250.0000 mg | ORAL_TABLET | ORAL | Status: DC
  Administered 2020-08-31: 250 mg via ORAL
  Filled 2020-08-31: qty 1

## 2020-08-31 MED ORDER — IBUPROFEN 600 MG PO TABS: 600.0000 mg | ORAL_TABLET | Freq: Four times a day (QID) | ORAL | 0 refills | Status: DC | PRN

## 2020-08-31 MED ORDER — COCONUT OIL OIL: 1.0000 "application " | TOPICAL_OIL | 0 refills | Status: DC | PRN

## 2020-08-31 MED ORDER — FERROUS SULFATE 325 (65 FE) MG PO TABS
325.0000 mg | ORAL_TABLET | ORAL | Status: DC
  Administered 2020-08-31: 325 mg via ORAL
  Filled 2020-08-31: qty 1

## 2020-08-31 MED ORDER — METFORMIN HCL 500 MG PO TABS
1000.0000 mg | ORAL_TABLET | Freq: Two times a day (BID) | ORAL | Status: DC
  Administered 2020-08-31 (×2): 1000 mg via ORAL
  Filled 2020-08-31 (×3): qty 2

## 2020-08-31 MED ORDER — METFORMIN HCL 500 MG PO TABS: 1000.0000 mg | ORAL_TABLET | Freq: Two times a day (BID) | ORAL | 3 refills | Status: DC

## 2020-08-31 NOTE — Plan of Care (Signed)
  Problem: Clinical Measurements: Goal: Ability to maintain clinical measurements within normal limits will improve Outcome: Completed/Met Goal: Will remain free from infection Outcome: Completed/Met Goal: Diagnostic test results will improve Outcome: Completed/Met Goal: Respiratory complications will improve Outcome: Completed/Met   Problem: Coping: Goal: Level of anxiety will decrease Outcome: Completed/Met   Problem: Elimination: Goal: Will not experience complications related to bowel motility Outcome: Completed/Met Goal: Will not experience complications related to urinary retention Outcome: Completed/Met   Problem: Pain Managment: Goal: General experience of comfort will improve Outcome: Completed/Met   Problem: Skin Integrity: Goal: Risk for impaired skin integrity will decrease Outcome: Completed/Met

## 2020-08-31 NOTE — Plan of Care (Signed)
  Problem: Clinical Measurements: Goal: Ability to maintain clinical measurements within normal limits will improve Outcome: Completed/Met Goal: Will remain free from infection Outcome: Completed/Met Goal: Diagnostic test results will improve Outcome: Completed/Met Goal: Respiratory complications will improve Outcome: Completed/Met   Problem: Coping: Goal: Level of anxiety will decrease Outcome: Completed/Met   Problem: Elimination: Goal: Will not experience complications related to bowel motility Outcome: Completed/Met Goal: Will not experience complications related to urinary retention Outcome: Completed/Met   Problem: Pain Managment: Goal: General experience of comfort will improve Outcome: Completed/Met   Problem: Skin Integrity: Goal: Risk for impaired skin integrity will decrease Outcome: Completed/Met   Problem: Life Cycle: Goal: Ability to make normal progression through stages of labor will improve Outcome: Completed/Met   Problem: Role Relationship: Goal: Will demonstrate positive interactions with the child Outcome: Completed/Met   Problem: Safety: Goal: Risk of complications during labor and delivery will decrease Outcome: Completed/Met   Problem: Pain Management: Goal: Relief or control of pain from uterine contractions will improve Outcome: Completed/Met   Problem: Life Cycle: Goal: Chance of risk for complications during the postpartum period will decrease Outcome: Completed/Met   Problem: Role Relationship: Goal: Ability to demonstrate positive interaction with newborn will improve Outcome: Completed/Met   Problem: Skin Integrity: Goal: Demonstration of wound healing without infection will improve Outcome: Completed/Met

## 2020-08-31 NOTE — Discharge Instructions (Signed)
AREA FAMILY PRACTICE PHYSICIANS  [place] ([address]) . [facility] o [address] [place], [place] [address] o [phone] o Mon-Fri 8:30-12:30, 1:30-5:00 o Accepting Medicaid . [facility] o [address], [place], [place] [address] o [phone] o Mon-Fri 8:00-5:30 . [facility] o [address] [place], [place] [address] o [phone] o Mon, Tue, Thur, Fri 8:30-5:00, Wed 10:00-7:00 (closed 1-2pm) o Accepting Medicaid . [facility] o [address], [place], [place]  [address] o Phone - [phone]   Fax - [phone]  [place] ([address]) . [facility] o [address] [place], [place] [address] o [phone] o Mon-Fri 8:00-5:00 . [facility]  o [address] [place], [place] [address] o [phone] o Mon-Fri 8:30-5:30, Sat (Oct.-Mar.) 9:00-1:00 o Accepting Medicaid  [place] ([address]) . [facility] o [address], [place], [place] [address] o [phone] o Mon-Fri 8:00-5:00  [place] ([address]) . [facility] o [address], [place], [place] [address] o [phone] o Mon-Fri 8:00-5:00 . [facility] o [address], [place], [place] [address] o [phone] o Mon-Fri 8:00-5:00 . [facility] o [address] [place], [place] [address] o [phone] o Mon-Fri 8:00-5:00 . [facility] o [address] [place] [address] o [phone] o Mon-Fri 7:30-5:30  [place] ([address] & [address]) . [facility] o [address] [place], [place] [address] o [phone] o Mon-Thur 8:00-6:00 o Accepting Medicaid . [facility] o [address] [place], [place] [address] o [phone] o Mon-Thur 7:30-7:30, Fri 7:30-4:30 o Accepting Medicaid . [facility] o [address], [place], [place]  [address] o [phone]   Fax - [phone]  [place] ([address] & [address]) . [facility] o [address] [place], [place] [address] o [phone] o Mon-Fri 7:00-5:00 . [facility] o [address], [place], [place] [address] o [phone] o Mon-Fri 8:00-5:00 o Accepting Medicaid . [facility] o [address],  [place], [place] [address] o [phone] o Mon-Fri 8:00-5:00 o Accepting Medicaid  [place] ([address]) . [facility] [place] o [address] [place], [place] [address] o [phone] o Mon-Fri 8:00-5:00 . [facility] o [address], [place], [place] [address] o [phone] o Mon-Fri 8:00-5:00 o Accepting Medicaid . [facility] o [address], [place], [place] [address] o [phone] o Mon-Fri 8:00-5:30, Sat&Sun by appointment (phones open at 8:30) o Accepting Medicaid  [place] ([address]) . [facility] o [address] [place], [place] [address] o [phone] o Mon-Thur 8:00-7:00, Fri 8:00-5:00, Sat 8:00-12:00, Sun 9:00-12:00 o Accepting Medicaid . [facility] [place] o [address], [place], [place] [address] o [phone] o Mon-Thur 8:00-5:00 o Accepting Medicaid . [facility] o [address] [place],  [address] o [phone] o Mon-Fri 8:00-5:30, Sat (Oct.-Mar.) 9:00-1:00 o Accepting Medicaid  [place]  ([address]) .   [facility] o [address] [place], [place] [address] o [phone] o Mon-Fri 8:00-5:00 o Accepting Medicaid   [facility] [phone]) . [facility] o [address], [place], [place] [address] o [phone] o Mon-Fri 8:00-5:00 . Nature conservation officer at [facility] o [address], [place], [place] [address] o [phone] o Mon-Fri 8:00-5:00 . [facility] [place] o [address], [place], [place] [address] o [phone] o Mon-Fri 8:00-5:00 o After hours clinic [facility] [place], [place] [address]) [phone] Mon-Fri 5:00-8:00, Sat 12:00-6:00, Sun 10:00-4:00 o Accepting Medicaid . [facility] o [address] [place], [place]  [address] o [phone]   Fax - [phone]  [place] [phone]) . Nature conservation officer at [facility] o [address] [place], [place] [address] o [phone] o Mon-Fri 8:00-5:00 . [facility] [place] [facility] [place] o [address], [place], [place] [address] o [phone] o Mon-Thur 8:00-7:00, Fri 8:00-5:00, Sat 8:00-12:00    Diabetes Mellitus and Nutrition, Adult When you have diabetes, or diabetes mellitus, it is very important to have healthy eating habits because your blood sugar (glucose) levels are greatly affected by what you eat and drink. Eating healthy foods in the right amounts, at about the same times every day, can help you:  Control your blood glucose.  Lower your risk of heart disease.  Improve your blood pressure.  Reach or maintain a healthy weight. What can affect my meal plan? Every person with diabetes is different, and each person has different needs for a meal plan. Your health care provider may recommend that you work with a dietitian to make a meal plan that is best for you. Your meal plan may vary depending on factors such as:  The  calories you need.  The medicines you take.  Your weight.  Your blood glucose, blood pressure, and cholesterol levels.  Your activity level.  Other health conditions you have, such as heart or kidney disease. How do carbohydrates affect me? Carbohydrates, also called carbs, affect your blood glucose level more than any other type of food. Eating carbs naturally raises the amount of glucose in your blood. Carb counting is a method for keeping track of how many carbs you eat. Counting carbs is important to keep your blood glucose at a healthy level, especially if you use insulin or take certain oral diabetes medicines. It is important to know how many carbs you can safely have in each meal. This is different for every person. Your dietitian can help you calculate how many carbs you should have at each meal and for each snack. How does alcohol affect me? Alcohol can cause a sudden decrease in blood glucose (hypoglycemia), especially if you use insulin or take certain oral diabetes medicines. Hypoglycemia can be a life-threatening condition. Symptoms of hypoglycemia, such as sleepiness, dizziness, and confusion, are similar to symptoms of having too much alcohol.  Do not drink alcohol if: ? Your health care provider tells you not to drink. ? You are pregnant, may be pregnant, or are planning to become pregnant.  If you drink alcohol: ? Do not drink on an empty stomach. ? Limit how much you use to:  0-1 drink a day for women.  0-2 drinks a day for men. ? Be aware of how much alcohol is in your drink. In the [place]  one drink equals one 12 oz bottle of beer (355 mL), one 5 oz glass of wine (148 mL), or one 1 oz glass of hard liquor (44 mL). ? Keep yourself hydrated with water, diet soda, or unsweetened iced tea.  Keep in mind that regular soda, juice, and other mixers may contain a lot of sugar and must be counted as carbs. What are tips for following this plan? Reading food labels  Start  by checking the serving size on the "Nutrition Facts" label of packaged foods and drinks. The amount of calories, carbs, fats, and other nutrients listed on the label is based on one serving of the item. Many items contain more than one serving per package.  Check the total grams (g) of carbs in one serving. You can calculate the number of servings of carbs in one serving by dividing the total carbs by 15. For example, if a food has 30 g of total carbs per serving, it would be equal to 2 servings of carbs.  Check the number of grams (g) of saturated fats and trans fats in one serving. Choose foods that have a low amount or none of these fats.  Check the number of milligrams (mg) of salt (sodium) in one serving. Most people should limit total sodium intake to less than 2,300 mg per day.  Always check the nutrition information of foods labeled as "low-fat" or "nonfat." These foods may be higher in added sugar or refined carbs and should be avoided.  Talk to your dietitian to identify your daily goals for nutrients listed on the label. Shopping  Avoid buying canned, pre-made, or processed foods. These foods tend to be high in fat, sodium, and added sugar.  Shop around the outside edge of the grocery store. This is where you will most often find fresh fruits and vegetables, bulk grains, fresh meats, and fresh dairy. Cooking  Use low-heat cooking methods, such as baking, instead of high-heat cooking methods like deep frying.  Cook using healthy oils, such as olive, canola, or sunflower oil.  Avoid cooking with butter, cream, or high-fat meats. Meal planning  Eat meals and snacks regularly, preferably at the same times every day. Avoid going long periods of time without eating.  Eat foods that are high in fiber, such as fresh fruits, vegetables, beans, and whole grains. Talk with your dietitian about how many servings of carbs you can eat at each meal.  Eat 4-6 oz (112-168 g) of lean protein  each day, such as lean meat, chicken, fish, eggs, or tofu. One ounce (oz) of lean protein is equal to: ? 1 oz (28 g) of meat, chicken, or fish. ? 1 egg. ?  cup (62 g) of tofu.  Eat some foods each day that contain healthy fats, such as avocado, nuts, seeds, and fish.   What foods should I eat? Fruits Berries. Apples. Oranges. Peaches. Apricots. Plums. Grapes. Mango. Papaya. Pomegranate. Kiwi. Cherries. Vegetables Lettuce. Spinach. Leafy greens, including kale, chard, collard greens, and mustard greens. Beets. Cauliflower. Cabbage. Broccoli. Carrots. Green beans. Tomatoes. Peppers. Onions. Cucumbers. Brussels sprouts. Grains Whole grains, such as whole-wheat or whole-grain bread, crackers, tortillas, cereal, and pasta. Unsweetened oatmeal. Quinoa. Brown or wild rice. Meats and other proteins Seafood. Poultry without skin. Lean cuts of poultry and beef. Tofu. Nuts. Seeds. Dairy Low-fat or fat-free dairy products such as milk, yogurt, and cheese. The items listed above may not be a complete list of foods and beverages you can eat. Contact a dietitian  for more information. What foods should I avoid? Fruits Fruits canned with syrup. Vegetables Canned vegetables. Frozen vegetables with butter or cream sauce. Grains Refined white flour and flour products such as bread, pasta, snack foods, and cereals. Avoid all processed foods. Meats and other proteins Fatty cuts of meat. Poultry with skin. Breaded or fried meats. Processed meat. Avoid saturated fats. Dairy Full-fat yogurt, cheese, or milk. Beverages Sweetened drinks, such as soda or iced tea. The items listed above may not be a complete list of foods and beverages you should avoid. Contact a dietitian for more information. Questions to ask a health care provider  Do I need to meet with a diabetes educator?  Do I need to meet with a dietitian?  What number can I call if I have questions?  When are the best times to check my blood  glucose? Where to find more information:  American Diabetes Association: diabetes.org  Academy of Nutrition and Dietetics: www.eatright.[employer] of Diabetes and Digestive and Kidney Diseases: CarFlippers.tn  Association of Diabetes Care and Education Specialists: www.diabeteseducator.org Summary  It is important to have healthy eating habits because your blood sugar (glucose) levels are greatly affected by what you eat and drink.  A healthy meal plan will help you control your blood glucose and maintain a healthy lifestyle.  Your health care provider may recommend that you work with a dietitian to make a meal plan that is best for you.  Keep in mind that carbohydrates (carbs) and alcohol have immediate effects on your blood glucose levels. It is important to count carbs and to use alcohol carefully. This information is not intended to replace advice given to you by your health care provider. Make sure you discuss any questions you have with your health care provider. Document Revised: 03/14/2019 Document Reviewed: 03/14/2019 Elsevier Patient Education  2021 [employer].

## 2020-08-31 NOTE — Lactation Note (Signed)
This note was copied from a baby's chart. Lactation Consultation Note  Patient Name: Linda Torres FSELT'R Date: 08/31/2020 Reason for consult: Follow-up assessment;Difficult latch;Term Age:27 hours  Mom reports she is very anxious about going home because infant doesn't always latch. Observed mom latching and assisted.  Discussed ways to help with latching, breast support, bringing infant close, prepumping.  Mom reports she has a manual pump but no electric pump.  Discussed options for electric pump.  Urged to offer the breast based on cues and at least 8-12 times day. Praised breastfeeding.  Mom reports her goal is to exclusively breastfeed with this baby.   Urged to call lactation as needed.   Maternal Data    Feeding Mother's Current Feeding Choice: Breast Milk  LATCH Score Latch: Grasps breast easily, tongue down, lips flanged, rhythmical sucking.  Audible Swallowing: Spontaneous and intermittent  Type of Nipple: Everted at rest and after stimulation  Comfort (Breast/Nipple): Soft / non-tender  Hold (Positioning): Assistance needed to correctly position infant at breast and maintain latch.  LATCH Score: 9   Lactation Tools Discussed/Used Reason for Pumping: mom reprtos pumping with DEBp becuase she cant always get her to latch/ mom reprtos she is very anxious going home withput having an electric breastpump. Mom does have manual for home use. Pumping frequency: when infant does not latch and prepump if infant consitnues to struggle with latching  Interventions Interventions: Breast feeding basics reviewed;Assisted with latch;Breast massage;Hand express;Pre-pump if needed;Position options;Expressed milk;Hand pump;DEBP  Discharge Discharge Education: Warning signs for feeding baby Pump: Manual  Consult Status Consult Status: Complete Date: 08/31/20 Follow-up type: Out-patient    Jasiel Apachito S Breniya Goertzen 08/31/2020, 1:10 PM

## 2020-09-02 ENCOUNTER — Inpatient Hospital Stay (HOSPITAL_COMMUNITY): Admit: 2020-09-02 | Payer: Self-pay

## 2020-09-02 ENCOUNTER — Telehealth: Payer: Self-pay

## 2020-09-02 LAB — BPAM RBC
Blood Product Expiration Date: 202206082359
Unit Type and Rh: 5100

## 2020-09-02 LAB — TYPE AND SCREEN
ABO/RH(D): O POS
Antibody Screen: NEGATIVE
Unit division: 0

## 2020-09-02 NOTE — Telephone Encounter (Signed)
Transition Care Management Unsuccessful Follow-up Telephone Call  Date of discharge and from where:  08/31/2020 from [facility]  Attempts:  1st Attempt  Reason for unsuccessful TCM follow-up call:  Left voice message

## 2020-09-03 NOTE — Telephone Encounter (Signed)
Transition Care Management Unsuccessful Follow-up Telephone Call  Date of discharge and from where:  08/31/2020 from [facility]  Attempts:  2nd Attempt  Reason for unsuccessful TCM follow-up call:  Left voice message

## 2020-09-04 NOTE — Telephone Encounter (Signed)
Transition Care Management Unsuccessful Follow-up Telephone Call  Date of discharge and from where:  08/31/2020 from [facility]  Attempts:  3rd Attempt  Reason for unsuccessful TCM follow-up call:  Unable to reach patient

## 2020-10-02 ENCOUNTER — Telehealth: Payer: Self-pay

## 2020-10-02 NOTE — Telephone Encounter (Signed)
Call patient to follow-up on Diabetes type 2 and try and assist her on getting a pcp. No answer or voice mail to leave a message. Patient has a follow/up appt on 10/10/2020.

## 2020-10-02 NOTE — Telephone Encounter (Signed)
-----  Message from Laury Deep, [place] sent at 08/31/2020 12:24 PM EDT ----- Regarding: Diabetic Dr. Sylvester Harder discharged this patient with a Type 2 Diabetic diagnosis. I sent in a Rx for a meter, lancet kit and to meet with DM educator. She will need a PCP. I'm not sure if you all refer patients or they are left to find one on their own. Please contact the patient either way to let her know what her next steps should be.  Thanks, Laury Deep, CNM

## 2020-10-10 ENCOUNTER — Ambulatory Visit: Payer: Medicaid Other

## 2020-10-16 ENCOUNTER — Ambulatory Visit: Payer: Medicaid Other | Admitting: Registered"

## 2021-11-02 IMAGING — US US MFM OB DETAIL+14 WK
1 series · 13 of 28 positions shown · non-contrast
Comparison: none

[Series 1: us mfm ob detail+14 wk · 87 acquisitions, 13 frames shown]
[im 4/87]
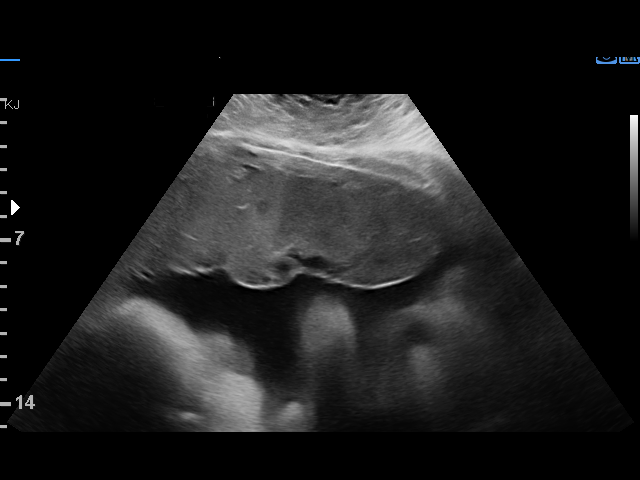
[im 10/87]
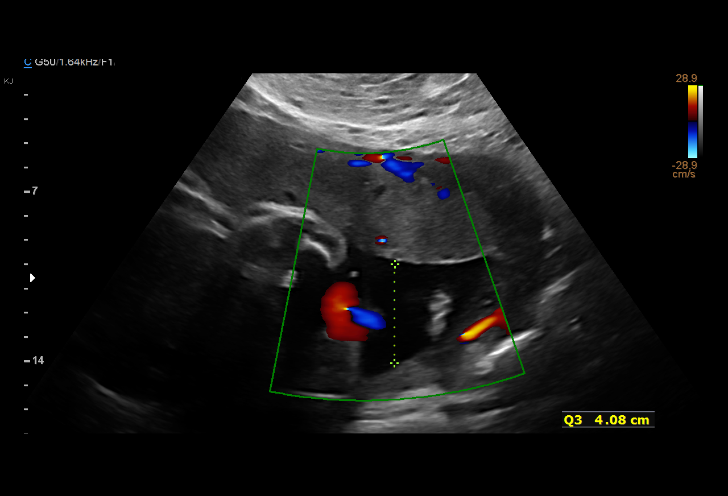
[im 16/87]
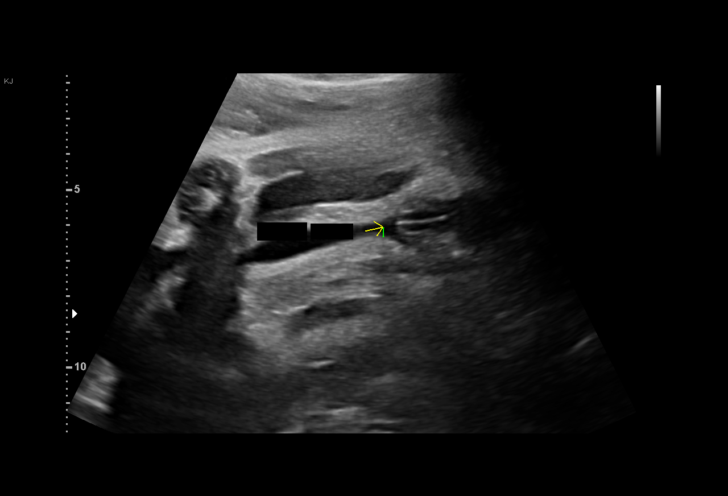
[im 23/87]
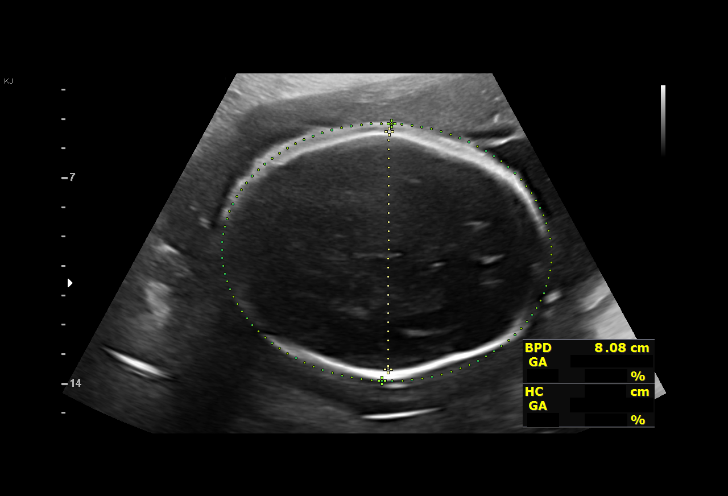
[im 29/87]
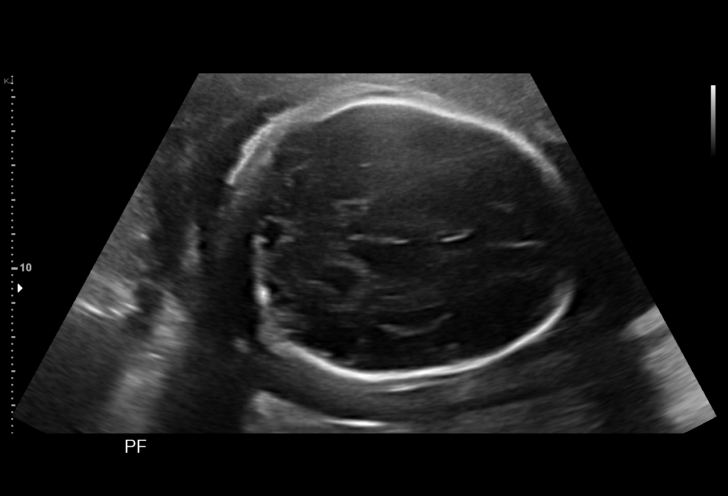
[im 36/87]
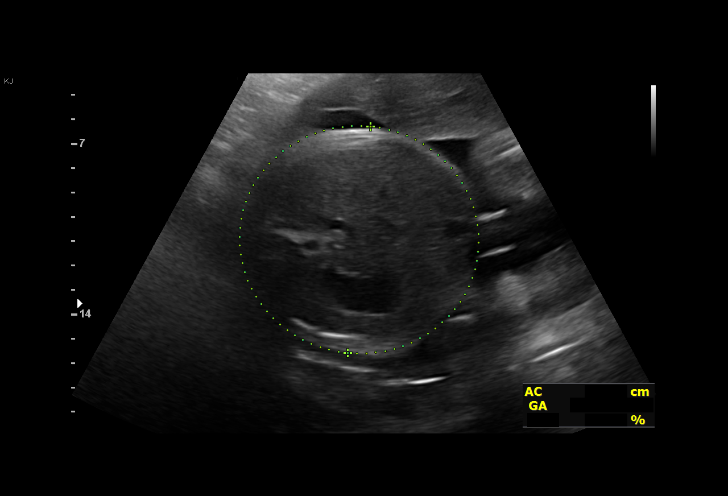
[im 45/87]
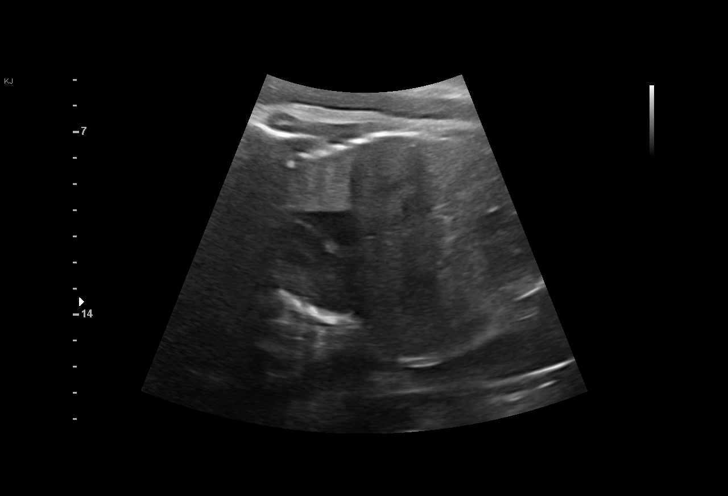
[im 51/87]
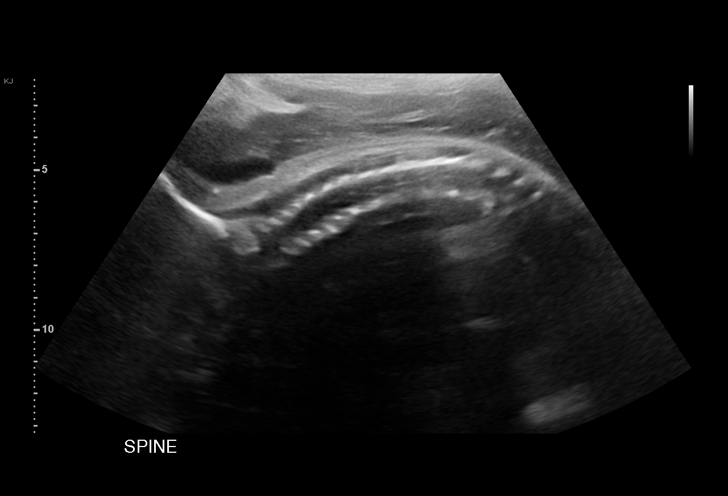
[im 58/87]
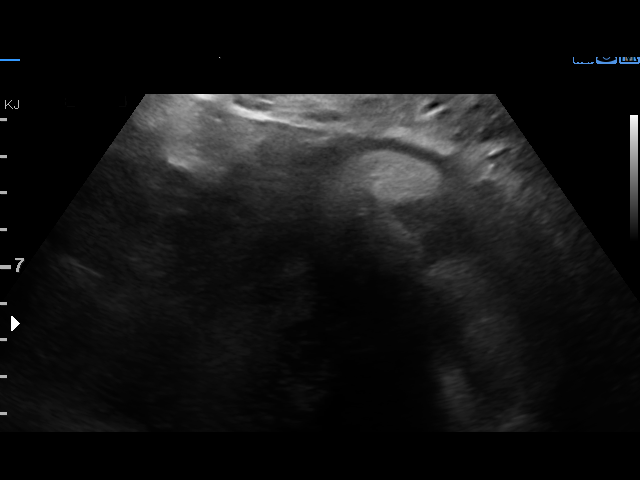
[im 64/87]
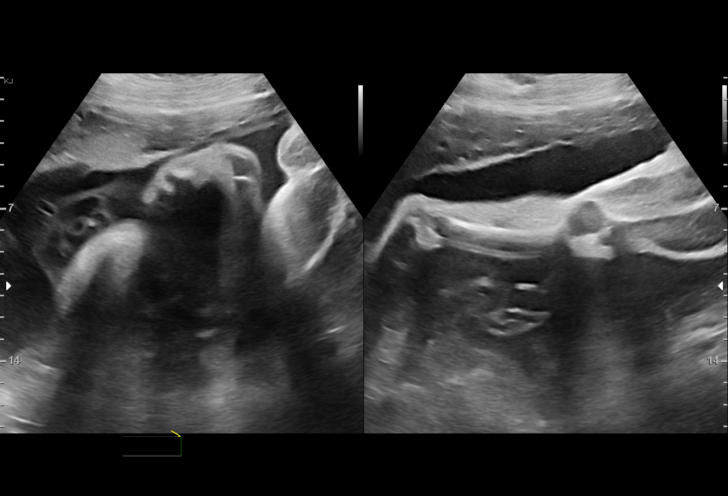
[im 71/87]
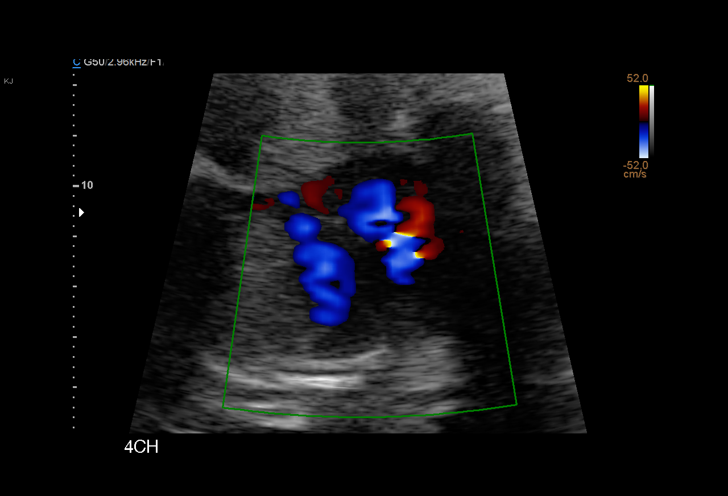
[im 77/87]
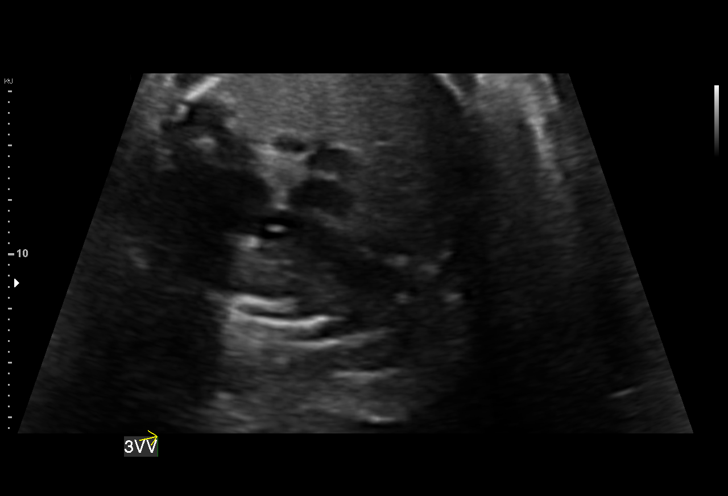
[im 83/87]
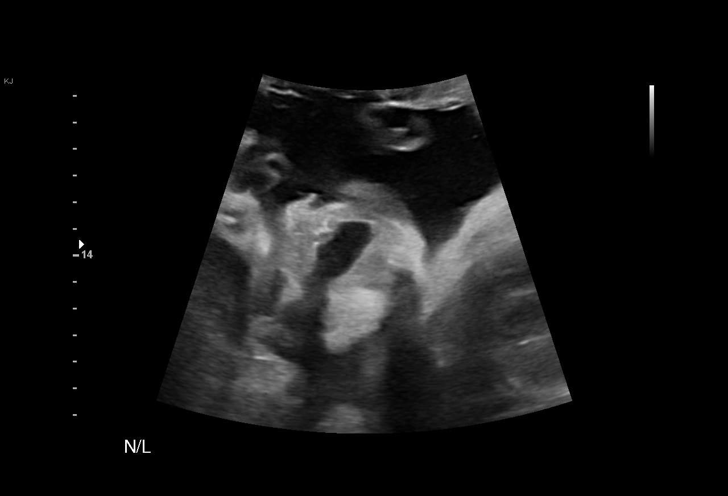

[13 of 28 positions shown; findings below may reference images not displayed]

Indications

 Obesity complicating pregnancy, third
 trimester (BMI 55 pregravid)
 Insufficient Prenatal Care
 31 weeks gestation of pregnancy
Fetal Evaluation

 Num Of Fetuses:         1
 Fetal Heart Rate(bpm):  148
 Cardiac Activity:       Observed
 Presentation:           Breech
 Placenta:               Anterior
 P. Cord Insertion:      Visualized, central

 Amniotic Fluid
 AFI FV:      Within normal limits

 AFI Sum(cm)     %Tile       Largest Pocket(cm)
 17.29           64

 RUQ(cm)       RLQ(cm)       LUQ(cm)        LLQ(cm)

Biometry

 BPD:      80.1  mm     G. Age:  32w 1d         59  %    CI:        67.31   %    70 - 86
                                                         FL/HC:      19.9   %    19.1 -
 HC:      312.6  mm     G. Age:  35w 0d         93  %    HC/AC:      1.05        0.96 -
 AC:      298.4  mm     G. Age:  33w 6d         95  %    FL/BPD:     77.5   %    71 - 87
 FL:       62.1  mm     G. Age:  32w 1d         54  %    FL/AC:      20.8   %    20 - 24
 HUM:        55  mm     G. Age:  32w 0d         60  %
 CER:      41.2  mm     G. Age:  32w 6d         66  %

 CM:        5.1  mm

 Est. FW:    3728  gm    4 lb 12 oz      90  %
OB History

 Gravidity:    4         Term:   3        Prem:   0        SAB:   0
 TOP:          0       Ectopic:  0        Living: 3
Gestational Age

 LMP:           31w 4d        Date:  11/27/19                 EDD:   09/02/20
 U/S Today:     33w 2d                                        EDD:   08/21/20
 Best:          31w 4d     Det. By:  LMP  (11/27/19)          EDD:   09/02/20
Anatomy

 Cranium:               Appears normal         LVOT:                   Not well visualized
 Cavum:                 Appears normal         Aortic Arch:            Not well visualized
 Ventricles:            Appears normal         Ductal Arch:            Not well visualized
 Choroid Plexus:        Appears normal         Diaphragm:              Appears normal
 Cerebellum:            Appears normal         Stomach:                Appears normal, left
                                                                       sided
 Posterior Fossa:       Appears normal         Abdomen:                Appears normal
 Nuchal Fold:           Not applicable (>20    Abdominal Wall:         Appears nml (cord
                        wks GA)                                        insert, abd wall)
 Face:                  Not well visualized    Cord Vessels:           Appears normal (3
                                                                       vessel cord)
 Lips:                  Appears normal         Kidneys:                Appear normal
 Palate:                Not well visualized    Bladder:                Appears normal
 Thoracic:              Appears normal         Spine:                  Limited views
                                                                       appear normal
 Heart:                 Appears normal         Upper Extremities:      Appears normal
                        (4CH, axis, and
                        situs)
 RVOT:                  Not well visualized    Lower Extremities:      Appears normal

 Other:  Fetus appears to be female. 3VV/T seen. Heels seen. SVC IVC not
         well visualized. Hands not well visualized.
Cervix Uterus Adnexa

 Cervix
 Length:           3.99  cm.
 Normal appearance by transabdominal scan.

 Adnexa
 No abnormality visualized.
Impression

 G4 P3.  Late prenatal care.  Patient is here for fetal anatomy
 scan.  She has not had screening for fetal aneuploidies.
 Obstetric history significant for 3 term vaginal deliveries.  Her
 previous pregnancy was complicated by gestational diabetes.
 Patient has not screened for gestational diabetes.  Her blood
 pressure today at her office is 137/75 mmHg.
 Fetal growth is appropriate for gestational age (the estimated
 fetal weight is at the 90th percentile).  Amniotic fluid is normal
 and good fetal activity seen.  Fetal anatomical survey
 appears normal but limited by advanced gestational age and
 maternal obesity.
 Maternal obesity imposes limitations on the resolution of
 images, and failure to detect fetal anomalies is more common
 in obese pregnant women.
Recommendations

 -An appointment was made for her to return in 3 weeks for
 fetal growth assessment.
 -I encouraged the patient to screen for gestational diabetes.
 If patient has gestational diabetes and requires insulin or oral
 hypoglycemics, we will recommend weekly antenatal testing.
                 Meck, Mariza De Jesus

## 2022-02-03 ENCOUNTER — Ambulatory Visit: Payer: Medicaid Other | Admitting: Family

## 2022-03-01 ENCOUNTER — Telehealth: Payer: Self-pay | Admitting: Emergency Medicine

## 2022-03-01 DIAGNOSIS — E119 Type 2 diabetes mellitus without complications: Secondary | ICD-10-CM

## 2022-03-01 DIAGNOSIS — R030 Elevated blood-pressure reading, without diagnosis of hypertension: Secondary | ICD-10-CM

## 2022-03-01 NOTE — Progress Notes (Signed)
Virtual Visit Consent   Linda Torres, you are scheduled for a virtual visit with a [facility] provider today. Just as with appointments in the office, your consent must be obtained to participate. Your consent will be active for this visit and any virtual visit you may have with one of our providers in the next 365 days. If you have a MyChart account, a copy of this consent can be sent to you electronically.  As this is a virtual visit, video technology does not allow for your provider to perform a traditional examination. This may limit your provider's ability to fully assess your condition. If your provider identifies any concerns that need to be evaluated in person or the need to arrange testing (such as labs, EKG, etc.), we will make arrangements to do so. Although advances in technology are sophisticated, we cannot ensure that it will always work on either your end or our end. If the connection with a video visit is poor, the visit may have to be switched to a telephone visit. With either a video or telephone visit, we are not always able to ensure that we have a secure connection.  By engaging in this virtual visit, you consent to the provision of healthcare and authorize for your insurance to be billed (if applicable) for the services provided during this visit. Depending on your insurance coverage, you may receive a charge related to this service.  I need to obtain your verbal consent now. Are you willing to proceed with your visit today? [name] has provided verbal consent on 03/01/2022 for a virtual visit (video or telephone). Carvel Getting, NP  Date: 03/01/2022 2:29 PM  Virtual Visit via Video Note   I, Carvel Getting, connected with  Linda Torres  ([id number], 01-09-1996) on 03/01/22 at  2:00 PM EST by a video-enabled telemedicine application and verified that I am speaking with the correct person using two identifiers.  Location: Patient: Virtual Visit Location Patient:  Home Provider: Virtual Visit Location Provider: Home Office   I discussed the limitations of evaluation and management by telemedicine and the availability of in person appointments. The patient expressed understanding and agreed to proceed.    History of Present Illness: Linda Torres is a 26 y.o. who identifies as a female who was assigned female at birth, and is being seen today for multiple complaints. Pt is approx [redacted] weeks pregnant has not yet had any prenatal care. Is worried because she has been monitoring her BP at home and it has been elevated over last 2 weeks, highest 178/100. Today is 141/82. Also worried about diabetes as she has not been monitoring her blood sugar. She is worried her blood sugar has been high as she feels her fetus is LGA. Also worries she is anemic as she has had anemia in the past. Lastly, cracked a wisdom tooth and is worried it is getting infected.   HPI: HPI  Problems:  Patient Active Problem List   Diagnosis Date Noted   Supervision of high risk pregnancy, antepartum 08/29/2020   Type 2 diabetes mellitus ([place]) 08/29/2020   Supervision of other normal pregnancy, antepartum 07/03/2020   Limited prenatal care 04/08/2019   SVD (spontaneous vaginal delivery) 04/08/2019   GBS (group B Streptococcus carrier), +RV culture, currently pregnant 04/06/2019   Alpha thalassemia silent carrier 02/06/2019   Anemia 01/19/2019   Unwanted fertility 01/19/2019   Supervision of low-risk pregnancy 01/16/2019   Morbid obesity ([place])     Allergies: No Known  Allergies Medications:  Current Outpatient Medications:    Accu-Chek Softclix Lancets lancets, Use as instructed, Disp: 100 each, Rfl: 12   acetaminophen (TYLENOL) 500 MG tablet, Take 2 tablets (1,000 mg total) by mouth every 6 (six) hours as needed., Disp: , Rfl:    ascorbic acid (VITAMIN C) 250 MG tablet, Take 1 tablet (250 mg total) by mouth every other day., Disp: , Rfl:    Blood Glucose Monitoring Suppl (ACCU-CHEK  GUIDE) w/Device KIT, 1 each by Does not apply route 4 (four) times daily., Disp: 1 kit, Rfl: 0   coconut oil OIL, Apply 1 application topically as needed., Disp: , Rfl: 0   ferrous sulfate 325 (65 FE) MG tablet, Take 1 tablet (325 mg total) by mouth every other day., Disp: , Rfl: 3   ibuprofen (ADVIL) 600 MG tablet, Take 1 tablet (600 mg total) by mouth every 6 (six) hours as needed., Disp: 30 tablet, Rfl: 0   metFORMIN (GLUCOPHAGE) 500 MG tablet, Take 2 tablets (1,000 mg total) by mouth 2 (two) times daily with a meal., Disp: 60 tablet, Rfl: 3   Prenatal Vit-Fe Fumarate-FA (PRENATAL MULTIVITAMIN) TABS tablet, Take 1 tablet by mouth daily at 12 noon., Disp: , Rfl:   Observations/Objective: Patient is well-developed, well-nourished in no acute distress.  Resting comfortably  at home.  Head is normocephalic, atraumatic.  No labored breathing.  Speech is clear and coherent with logical content.  Patient is alert and oriented at baseline.    Assessment and Plan: 1. Elevated blood pressure reading  2. Type 2 diabetes mellitus without complication, without long-term current use of insulin ([place])  I think pt needs urgent evaluation due to lack of prenatal care, elevated BP and hx of DM that may not be controlled at this point. Advised pt to go to MAU for evaluation.   Follow Up Instructions: I discussed the assessment and treatment plan with the patient. The patient was provided an opportunity to ask questions and all were answered. The patient agreed with the plan and demonstrated an understanding of the instructions.  A copy of instructions were sent to the patient via MyChart unless otherwise noted below.   The patient was advised to call back or seek an in-person evaluation if the symptoms worsen or if the condition fails to improve as anticipated.  Time:  I spent 10 minutes with the patient via telehealth technology discussing the above problems/concerns.    Carvel Getting, NP

## 2022-03-01 NOTE — Patient Instructions (Signed)
  [facility], thank you for joining Carvel Getting, NP for today's virtual visit.  While this provider is not your primary care provider (PCP), if your PCP is located in our provider database this encounter information will be shared with them immediately following your visit.   [facility] account gives you access to today's visit and all your visits, tests, and labs performed at [facility] " click here if you don't have a [facility] account or go to [email or web address]  Consent: (Patient) Linda Torres provided verbal consent for this virtual visit at the beginning of the encounter.  Current Medications:  Current Outpatient Medications:    Accu-Chek Softclix Lancets lancets, Use as instructed, Disp: 100 each, Rfl: 12   acetaminophen (TYLENOL) 500 MG tablet, Take 2 tablets (1,000 mg total) by mouth every 6 (six) hours as needed., Disp: , Rfl:    ascorbic acid (VITAMIN C) 250 MG tablet, Take 1 tablet (250 mg total) by mouth every other day., Disp: , Rfl:    Blood Glucose Monitoring Suppl (ACCU-CHEK GUIDE) w/Device KIT, 1 each by Does not apply route 4 (four) times daily., Disp: 1 kit, Rfl: 0   coconut oil OIL, Apply 1 application topically as needed., Disp: , Rfl: 0   ferrous sulfate 325 (65 FE) MG tablet, Take 1 tablet (325 mg total) by mouth every other day., Disp: , Rfl: 3   ibuprofen (ADVIL) 600 MG tablet, Take 1 tablet (600 mg total) by mouth every 6 (six) hours as needed., Disp: 30 tablet, Rfl: 0   metFORMIN (GLUCOPHAGE) 500 MG tablet, Take 2 tablets (1,000 mg total) by mouth 2 (two) times daily with a meal., Disp: 60 tablet, Rfl: 3   Prenatal Vit-Fe Fumarate-FA (PRENATAL MULTIVITAMIN) TABS tablet, Take 1 tablet by mouth daily at 12 noon., Disp: , Rfl:    Medications ordered in this encounter:  No orders of the defined types were placed in this encounter.    *If you need refills on other medications prior to your next appointment, please  contact your pharmacy*  Follow-Up: Call back or seek an in-person evaluation if the symptoms worsen or if the condition fails to improve as anticipated.  [place] [phone]  Other Instructions Please seek care in the Maternal Admission Unit of women's hospital today.    If you have been instructed to have an in-person evaluation today at a local Urgent Care facility, please use the link below. It will take you to a list of all of our available [facility], including address, phone number and hours of operation. Please do not delay care.  [facility]  If you or a family member do not have a primary care provider, use the link below to schedule a visit and establish care. When you choose a [facility] primary care physician or advanced practice provider, you gain a long-term partner in health. Find a Primary Care Provider  Learn more about [facility]'s in-office and virtual care options: [facility]

## 2022-04-20 NOTE — L&D Delivery Note (Signed)
OB/GYN Faculty Practice Delivery Note  Linda Torres is a 27 y.o. [id number] s/p SVD at [redacted]w[redacted]d She was admitted for SROM with SOL, IUFD discovered on admission to MAU.   ROM: 10h 461mith meconium stained fluid GBS Status: unknown Maximum Maternal Temperature: 99.7  Labor Progress: [facility] and progressed to complete off one dose of cytotec, PCA pump in use for pain control (no epidural per anesthesia due to high risk of complications and non NPO status)  Delivery Date/Time: 05/30/22 at 2042 Delivery: Called to room and patient was complete and pushing. Frank breech delivery of fetal bottom, then legs, body and head followed shortly after without complication. Cord clamped and cut by CNM, baby handed off to RN to be swaddled. Cord blood drawn. Placenta delivered spontaneously, intact, with 3-vessel cord. Fundus firm with massage and Pitocin, TXA and methergine given for PPH prophylaxis given high PPH risk. Labia, perineum, vagina, and cervix inspected, no laceration found.  Placenta: spontaneous, intact, sent to pathology Complications: None other than known IUFD Lacerations: None EBL: 0 Analgesia: PCA pump  Postpartum Planning [x]$  transfer orders to antenatal [x]$  discharge summary started & shared [x]$  message to sent to schedule follow-up  [x]$  lists updated [x]$  vaccines UTD  Infant: Girl  APGARs N/A  4250g  Linda GeroldCNM, IBLindcoveertified Nurse Midwife, FaTelecare Heritage Psychiatric Health Facilityor WoDean Foods CompanyCoMillvilleroup 05/30/2022, 11:18 PM

## 2022-05-30 ENCOUNTER — Encounter (HOSPITAL_COMMUNITY): Payer: Self-pay | Admitting: *Deleted

## 2022-05-30 ENCOUNTER — Inpatient Hospital Stay (HOSPITAL_COMMUNITY): Payer: Medicaid Other

## 2022-05-30 ENCOUNTER — Other Ambulatory Visit: Payer: Self-pay

## 2022-05-30 ENCOUNTER — Inpatient Hospital Stay (HOSPITAL_COMMUNITY)
Admission: AD | Admit: 2022-05-30 | Discharge: 2022-05-31 | DRG: 805 | Disposition: A | Discharge status: Home / Self Care | Payer: Medicaid Other | Attending: Obstetrics & Gynecology | Admitting: Obstetrics & Gynecology

## 2022-05-30 ENCOUNTER — Inpatient Hospital Stay: Payer: Self-pay

## 2022-05-30 DIAGNOSIS — O1002 Pre-existing essential hypertension complicating childbirth: Secondary | ICD-10-CM | POA: Diagnosis not present

## 2022-05-30 DIAGNOSIS — Z148 Genetic carrier of other disease: Secondary | ICD-10-CM

## 2022-05-30 DIAGNOSIS — O364XX0 Maternal care for intrauterine death, not applicable or unspecified: Secondary | ICD-10-CM | POA: Diagnosis not present

## 2022-05-30 DIAGNOSIS — Z3A39 39 weeks gestation of pregnancy: Secondary | ICD-10-CM | POA: Diagnosis not present

## 2022-05-30 DIAGNOSIS — E119 Type 2 diabetes mellitus without complications: Secondary | ICD-10-CM | POA: Diagnosis not present

## 2022-05-30 DIAGNOSIS — O4202 Full-term premature rupture of membranes, onset of labor within 24 hours of rupture: Secondary | ICD-10-CM

## 2022-05-30 DIAGNOSIS — Z7984 Long term (current) use of oral hypoglycemic drugs: Secondary | ICD-10-CM | POA: Diagnosis not present

## 2022-05-30 DIAGNOSIS — O2412 Pre-existing diabetes mellitus, type 2, in childbirth: Secondary | ICD-10-CM | POA: Diagnosis not present

## 2022-05-30 DIAGNOSIS — O134 Gestational [pregnancy-induced] hypertension without significant proteinuria, complicating childbirth: Secondary | ICD-10-CM

## 2022-05-30 DIAGNOSIS — O99214 Obesity complicating childbirth: Secondary | ICD-10-CM | POA: Diagnosis not present

## 2022-05-30 DIAGNOSIS — O321XX0 Maternal care for breech presentation, not applicable or unspecified: Secondary | ICD-10-CM | POA: Diagnosis present

## 2022-05-30 DIAGNOSIS — O368330 Maternal care for abnormalities of the fetal heart rate or rhythm, third trimester, not applicable or unspecified: Secondary | ICD-10-CM

## 2022-05-30 DIAGNOSIS — O99824 Streptococcus B carrier state complicating childbirth: Secondary | ICD-10-CM | POA: Diagnosis not present

## 2022-05-30 DIAGNOSIS — O26893 Other specified pregnancy related conditions, third trimester: Secondary | ICD-10-CM | POA: Diagnosis not present

## 2022-05-30 HISTORY — DX: Anemia, unspecified: D64.9

## 2022-05-30 LAB — CBC WITH DIFFERENTIAL/PLATELET
Abs Immature Granulocytes: 0 10*3/uL (ref 0.00–0.07)
Basophils Absolute: 0.1 10*3/uL (ref 0.0–0.1)
Basophils Relative: 2 %
Eosinophils Absolute: 0.1 10*3/uL (ref 0.0–0.5)
Eosinophils Relative: 1 %
HCT: 35.3 % — ABNORMAL LOW (ref 36.0–46.0)
Hemoglobin: 11.2 g/dL — ABNORMAL LOW (ref 12.0–15.0)
Lymphocytes Relative: 10 %
Lymphs Abs: 0.7 10*3/uL (ref 0.7–4.0)
MCH: 21.7 pg — ABNORMAL LOW (ref 26.0–34.0)
MCHC: 31.7 g/dL (ref 30.0–36.0)
MCV: 68.5 fL — ABNORMAL LOW (ref 80.0–100.0)
Monocytes Absolute: 0.1 10*3/uL (ref 0.1–1.0)
Monocytes Relative: 1 %
Neutro Abs: 6.1 10*3/uL (ref 1.7–7.7)
Neutrophils Relative %: 86 %
Platelets: 209 10*3/uL (ref 150–400)
RBC: 5.15 MIL/uL — ABNORMAL HIGH (ref 3.87–5.11)
RDW: 19.4 % — ABNORMAL HIGH (ref 11.5–15.5)
WBC: 7.1 10*3/uL (ref 4.0–10.5)
nRBC: 0 % (ref 0.0–0.2)
nRBC: 0 /100 WBC

## 2022-05-30 LAB — URINALYSIS, ROUTINE W REFLEX MICROSCOPIC
Bacteria, UA: NONE SEEN
Bilirubin Urine: NEGATIVE
Glucose, UA: >=500 mg/dL — AB
Ketones, ur: 80 mg/dL — AB
Leukocytes,Ua: NEGATIVE
Nitrite: NEGATIVE
Protein, ur: NEGATIVE mg/dL
Specific Gravity, Urine: 1.021 (ref 1.005–1.030)
pH: 6 (ref 5.0–8.0)

## 2022-05-30 LAB — COMPREHENSIVE METABOLIC PANEL
ALT: 12 U/L (ref 0–44)
AST: 21 U/L (ref 15–41)
Albumin: 2.8 g/dL — ABNORMAL LOW (ref 3.5–5.0)
Alkaline Phosphatase: 65 U/L (ref 38–126)
Anion gap: 12 (ref 5–15)
BUN: 5 mg/dL — ABNORMAL LOW (ref 6–20)
CO2: 18 mmol/L — ABNORMAL LOW (ref 22–32)
Calcium: 9.1 mg/dL (ref 8.9–10.3)
Chloride: 101 mmol/L (ref 98–111)
Creatinine, Ser: 0.53 mg/dL (ref 0.44–1.00)
GFR, Estimated: >60 mL/min (ref >60)
Glucose, Bld: 182 mg/dL — ABNORMAL HIGH (ref 70–99)
Potassium: 3.8 mmol/L (ref 3.5–5.1)
Sodium: 131 mmol/L — ABNORMAL LOW (ref 135–145)
Total Bilirubin: 0.3 mg/dL (ref 0.3–1.2)
Total Protein: 6.4 g/dL — ABNORMAL LOW (ref 6.5–8.1)

## 2022-05-30 LAB — POCT FERN TEST: POCT Fern Test: POSITIVE

## 2022-05-30 LAB — PROTEIN / CREATININE RATIO, URINE
Creatinine, Urine: 77 mg/dL
Protein Creatinine Ratio: 0.26 mg/mg{Cre} — ABNORMAL HIGH (ref 0.00–0.15)
Total Protein, Urine: 20 mg/dL

## 2022-05-30 LAB — CBC
HCT: 34.5 % — ABNORMAL LOW (ref 36.0–46.0)
Hemoglobin: 11.2 g/dL — ABNORMAL LOW (ref 12.0–15.0)
MCH: 22.1 pg — ABNORMAL LOW (ref 26.0–34.0)
MCHC: 32.5 g/dL (ref 30.0–36.0)
MCV: 68 fL — ABNORMAL LOW (ref 80.0–100.0)
Platelets: 209 10*3/uL (ref 150–400)
RBC: 5.07 MIL/uL (ref 3.87–5.11)
RDW: 19.2 % — ABNORMAL HIGH (ref 11.5–15.5)
WBC: 7.5 10*3/uL (ref 4.0–10.5)
nRBC: 0 % (ref 0.0–0.2)

## 2022-05-30 LAB — TYPE AND SCREEN
ABO/RH(D): O POS
Antibody Screen: NEGATIVE

## 2022-05-30 LAB — RAPID URINE DRUG SCREEN, HOSP PERFORMED
Amphetamines: NOT DETECTED
Barbiturates: NOT DETECTED
Benzodiazepines: NOT DETECTED
Cocaine: NOT DETECTED
Opiates: NOT DETECTED
Tetrahydrocannabinol: NOT DETECTED

## 2022-05-30 LAB — GLUCOSE, CAPILLARY
Glucose-Capillary: 180 mg/dL — ABNORMAL HIGH (ref 70–99)
Glucose-Capillary: 243 mg/dL — ABNORMAL HIGH (ref 70–99)
Glucose-Capillary: 198 mg/dL — ABNORMAL HIGH (ref 70–99)

## 2022-05-30 LAB — HIV ANTIBODY (ROUTINE TESTING W REFLEX): HIV Screen 4th Generation wRfx: NONREACTIVE

## 2022-05-30 LAB — HEMOGLOBIN A1C
Hgb A1c MFr Bld: 9.6 % — ABNORMAL HIGH (ref 4.8–5.6)
Mean Plasma Glucose: 228.82 mg/dL

## 2022-05-30 LAB — HEPATITIS C ANTIBODY: HCV Ab: NONREACTIVE

## 2022-05-30 LAB — APTT: aPTT: 24 seconds (ref 24–36)

## 2022-05-30 LAB — PROTIME-INR
INR: 0.9 (ref 0.8–1.2)
Prothrombin Time: 12.4 seconds (ref 11.4–15.2)

## 2022-05-30 LAB — FIBRINOGEN: Fibrinogen: 582 mg/dL — ABNORMAL HIGH (ref 210–475)

## 2022-05-30 LAB — HEPATITIS B SURFACE ANTIGEN: Hepatitis B Surface Ag: NONREACTIVE

## 2022-05-30 MED ORDER — LACTATED RINGERS IV SOLN: 500.0000 mL | INTRAVENOUS | Status: DC | PRN

## 2022-05-30 MED ORDER — ONDANSETRON HCL 4 MG/2ML IJ SOLN: 4.0000 mg | Freq: Four times a day (QID) | INTRAMUSCULAR | Status: DC | PRN

## 2022-05-30 MED ORDER — OXYTOCIN BOLUS FROM INFUSION
333.0000 mL | Freq: Once | INTRAVENOUS | Status: AC
  Administered 2022-05-30: 333 mL via INTRAVENOUS

## 2022-05-30 MED ORDER — DIPHENHYDRAMINE HCL 50 MG/ML IJ SOLN: 12.5000 mg | INTRAMUSCULAR | Status: DC | PRN

## 2022-05-30 MED ORDER — METHYLERGONOVINE MALEATE 0.2 MG/ML IJ SOLN
INTRAMUSCULAR | Status: AC
  Administered 2022-05-30: 0.2 mg
  Filled 2022-05-30: qty 1

## 2022-05-30 MED ORDER — TRANEXAMIC ACID-NACL 1000-0.7 MG/100ML-% IV SOLN
INTRAVENOUS | Status: AC
  Filled 2022-05-30: qty 100

## 2022-05-30 MED ORDER — EPHEDRINE 5 MG/ML INJ: 10.0000 mg | INTRAVENOUS | Status: DC | PRN

## 2022-05-30 MED ORDER — ACETAMINOPHEN 325 MG PO TABS: 650.0000 mg | ORAL_TABLET | ORAL | Status: DC | PRN

## 2022-05-30 MED ORDER — INSULIN ASPART 100 UNIT/ML IJ SOLN
0.0000 [IU] | INTRAMUSCULAR | Status: DC
  Administered 2022-05-30: 6 [IU] via SUBCUTANEOUS
  Administered 2022-05-30: 3 [IU] via SUBCUTANEOUS

## 2022-05-30 MED ORDER — OXYCODONE-ACETAMINOPHEN 5-325 MG PO TABS: 2.0000 | ORAL_TABLET | ORAL | Status: DC | PRN

## 2022-05-30 MED ORDER — MISOPROSTOL 25 MCG QUARTER TABLET
25.0000 ug | ORAL_TABLET | Freq: Once | ORAL | Status: DC
  Filled 2022-05-30: qty 1

## 2022-05-30 MED ORDER — NALOXONE HCL 0.4 MG/ML IJ SOLN: 0.4000 mg | INTRAMUSCULAR | Status: DC | PRN

## 2022-05-30 MED ORDER — DIPHENHYDRAMINE HCL 50 MG/ML IJ SOLN: 12.5000 mg | Freq: Four times a day (QID) | INTRAMUSCULAR | Status: DC | PRN

## 2022-05-30 MED ORDER — SODIUM CHLORIDE 0.9% FLUSH: 9.0000 mL | INTRAVENOUS | Status: DC | PRN

## 2022-05-30 MED ORDER — LORAZEPAM 2 MG/ML IJ SOLN
INTRAMUSCULAR | Status: AC
  Filled 2022-05-30: qty 1

## 2022-05-30 MED ORDER — HYDROMORPHONE 1 MG/ML IV SOLN: INTRAVENOUS | Status: DC

## 2022-05-30 MED ORDER — TERBUTALINE SULFATE 1 MG/ML IJ SOLN: 0.2500 mg | Freq: Once | INTRAMUSCULAR | Status: DC | PRN

## 2022-05-30 MED ORDER — FENTANYL CITRATE (PF) 100 MCG/2ML IJ SOLN
100.0000 ug | INTRAMUSCULAR | Status: DC | PRN
  Administered 2022-05-30 (×3): 100 ug via INTRAVENOUS
  Filled 2022-05-30 (×3): qty 2

## 2022-05-30 MED ORDER — LACTATED RINGERS IV SOLN: 500.0000 mL | Freq: Once | INTRAVENOUS | Status: DC

## 2022-05-30 MED ORDER — HYDROMORPHONE 1 MG/ML IV SOLN
INTRAVENOUS | Status: DC
  Administered 2022-05-30: 0.6 mL via INTRAVENOUS
  Administered 2022-05-30: 30 mg via INTRAVENOUS
  Filled 2022-05-30: qty 30

## 2022-05-30 MED ORDER — LACTATED RINGERS IV SOLN: INTRAVENOUS | Status: DC

## 2022-05-30 MED ORDER — LIDOCAINE HCL (PF) 1 % IJ SOLN: 30.0000 mL | INTRAMUSCULAR | Status: DC | PRN

## 2022-05-30 MED ORDER — PHENYLEPHRINE 80 MCG/ML (10ML) SYRINGE FOR IV PUSH (FOR BLOOD PRESSURE SUPPORT): 80.0000 ug | PREFILLED_SYRINGE | INTRAVENOUS | Status: DC | PRN

## 2022-05-30 MED ORDER — MISOPROSTOL 50MCG HALF TABLET
50.0000 ug | ORAL_TABLET | Freq: Once | ORAL | Status: DC
  Filled 2022-05-30: qty 1

## 2022-05-30 MED ORDER — LORAZEPAM 2 MG/ML IJ SOLN
1.0000 mg | Freq: Once | INTRAMUSCULAR | Status: AC
  Administered 2022-05-30: 1 mg via INTRAVENOUS

## 2022-05-30 MED ORDER — FENTANYL-BUPIVACAINE-NACL 0.5-0.125-0.9 MG/250ML-% EP SOLN: 12.0000 mL/h | EPIDURAL | Status: DC | PRN

## 2022-05-30 MED ORDER — SOD CITRATE-CITRIC ACID 500-334 MG/5ML PO SOLN: 30.0000 mL | ORAL | Status: DC | PRN

## 2022-05-30 MED ORDER — DIPHENHYDRAMINE HCL 12.5 MG/5ML PO ELIX: 12.5000 mg | ORAL_SOLUTION | Freq: Four times a day (QID) | ORAL | Status: DC | PRN

## 2022-05-30 MED ORDER — OXYTOCIN-SODIUM CHLORIDE 30-0.9 UT/500ML-% IV SOLN
1.0000 m[IU]/min | INTRAVENOUS | Status: DC
  Administered 2022-05-30: 2 m[IU]/min via INTRAVENOUS

## 2022-05-30 MED ORDER — OXYTOCIN-SODIUM CHLORIDE 30-0.9 UT/500ML-% IV SOLN
2.5000 [IU]/h | INTRAVENOUS | Status: DC
  Filled 2022-05-30: qty 500

## 2022-05-30 MED ORDER — TRANEXAMIC ACID-NACL 1000-0.7 MG/100ML-% IV SOLN
1000.0000 mg | Freq: Once | INTRAVENOUS | Status: AC
  Administered 2022-05-30: 1000 mg via INTRAVENOUS

## 2022-05-30 MED ORDER — OXYCODONE-ACETAMINOPHEN 5-325 MG PO TABS: 1.0000 | ORAL_TABLET | ORAL | Status: DC | PRN

## 2022-05-30 NOTE — Progress Notes (Signed)
Labor Progress Note Linda Torres is a 27 y.o. G5P4004 at 68w6dpresented for IUFD  S:  Feeling irregular ctx. Had requested epidural but not able to place d/t NPO status (per anesthesia).   O:  BP 133/76   Pulse (!) 126   Temp 98.8 F (37.1 C) (Oral)   Resp 18   Ht 5' 3"$  (1.6 m)   Wt (!) 148.2 kg   SpO2 99%   BMI 57.87 kg/m  SVE: Dilation: 7 Presentation: FPilar PlateBreech Exam by:: MColman Cater A/P: 27y.o. GAQ:[id number]. Labor: active, progressing well 2. Pain: analgesia/NO prn 3. T2DM: CBGs q4 w/ss 4. Elevated BP: doesn't meet criteria at this time, labs normal  Expectant mngt. Consider Pitocin if ctx space. Anticipate VD, Attending MD to be present d/t breech.  MeJulianne HandlerCNM 4:57 PM

## 2022-05-30 NOTE — Inpatient Diabetes Management (Signed)
Inpatient Diabetes Program Recommendations  AACE/ADA: New Consensus Statement on Inpatient Glycemic Control (2015)  Target Ranges:  Prepandial:   less than 140 mg/dL      Peak postprandial:   less than 180 mg/dL (1-2 hours)      Critically ill patients:  140 - 180 mg/dL   Lab Results  Component Value Date   GLUCAP 243 (H) 05/30/2022   HGBA1C 9.6 (H) 05/30/2022    Review of Glycemic Control  Diabetes history: DM 2 Outpatient Diabetes medications:  Metformin- unsure if patient was taking Current orders for Inpatient glycemic control:  Novolog 0-14 units q 4 hours  Inpatient Diabetes Program Recommendations:    Referral received.  Note IUFD.  Agree with orders.   Will likely need resumption of metformin when discharged from hospital.  Will follow.   Thanks.   Adah Perl, RN, BC-ADM Inpatient Diabetes Coordinator Pager [phone]  (8a-5p)

## 2022-05-30 NOTE — H&P (Signed)
OBSTETRIC ADMISSION HISTORY AND PHYSICAL  Linda Torres is a 27 y.o. female [phone] with IUP at 48w6dby LMP presenting for SROM @1000$  today. She reports +FMs. No LOF, VB, blurry vision, headaches, peripheral edema, or RUQ pain. Evaluation in MAU showed IUFD confirmed by [place]  Dating: By LMP --->  Estimated Date of Delivery: 05/31/22  Sono:  none  Prenatal History/Complications: -no PNC -[id number]-morbid obesity -gHTN -hx of macrosomic infant  Past Medical History: Past Medical History:  Diagnosis Date   Diabetes mellitus without complication ([place]    Morbid obesity ([place]     Past Surgical History: Past Surgical History:  Procedure Laterality Date   NO PAST SURGERIES      Obstetrical History: OB History     Gravida  5   Para  4   Term  4   Preterm      AB      Living  4      SAB      IAB      Ectopic      Multiple  0   Live Births  4           Social History: Social History   Socioeconomic History   Marital status: Married    Spouse name: Not on file   Number of children: Not on file   Years of education: Not on file   Highest education level: Not on file  Occupational History   Not on file  Tobacco Use   Smoking status: Never   Smokeless tobacco: Never  Vaping Use   Vaping Use: Never used  Substance and Sexual Activity   Alcohol use: Never   Drug use: Never   Sexual activity: Yes    Birth control/protection: None  Other Topics Concern   Not on file  Social History Narrative   Not on file   Social Determinants of Health   Financial Resource Strain: Not on file  Food Insecurity: Food Insecurity Present (01/16/2019)   Hunger Vital Sign    Worried About Running Out of Food in the Last Year: Often true    Ran Out of Food in the Last Year: Often true  Transportation Needs: Unmet Transportation Needs (01/16/2019)   PRAPARE - THydrologist(Medical): Yes    Lack of Transportation (Non-Medical): Yes   Physical Activity: Not on file  Stress: Not on file  Social Connections: Not on file    Family History: Family History  Problem Relation Age of Onset   Diabetes Mother    Depression Mother    Thyroid disease Mother    Depression Maternal Grandmother    Diabetes Maternal Grandmother    Thyroid disease Maternal Grandmother     Allergies: No Known Allergies  Medications Prior to Admission  Medication Sig Dispense Refill Last Dose   Accu-Chek Softclix Lancets lancets Use as instructed 100 each 12    acetaminophen (TYLENOL) 500 MG tablet Take 2 tablets (1,000 mg total) by mouth every 6 (six) hours as needed.      ascorbic acid (VITAMIN C) 250 MG tablet Take 1 tablet (250 mg total) by mouth every other day.      Blood Glucose Monitoring Suppl (ACCU-CHEK GUIDE) w/Device KIT 1 each by Does not apply route 4 (four) times daily. 1 kit 0    coconut oil OIL Apply 1 application topically as needed.  0    ferrous sulfate 325 (65 FE) MG tablet Take 1 tablet (325  mg total) by mouth every other day.  3    ibuprofen (ADVIL) 600 MG tablet Take 1 tablet (600 mg total) by mouth every 6 (six) hours as needed. 30 tablet 0    metFORMIN (GLUCOPHAGE) 500 MG tablet Take 2 tablets (1,000 mg total) by mouth 2 (two) times daily with a meal. 60 tablet 3    Prenatal Vit-Fe Fumarate-FA (PRENATAL MULTIVITAMIN) TABS tablet Take 1 tablet by mouth daily at 12 noon.      Review of Systems:  All systems reviewed and negative except as stated in HPI  PE: Blood pressure (!) 141/81, pulse (!) 122, temperature 97.9 F (36.6 C), temperature source Oral, resp. rate 20, height 5' 3"$  (1.6 m), weight (!) 148.2 kg, SpO2 99 %, unknown if currently breastfeeding. General appearance: alert, cooperative, and mild distress Lungs: regular rate and effort Heart: regular rate  Abdomen: soft, non-tender Extremities: Homans sign is negative, no sign of DVT Presentation: breech by [place]  Prenatal labs: ABO, Rh:   Antibody:    Rubella:   RPR:    HBsAg:    HIV:    GBS:      Prenatal Transfer Tool  Maternal Diabetes: Yes:  Diabetes Type:  Pre-pregnancy Genetic Screening: n/a Maternal Ultrasounds/Referrals: n/a Fetal Ultrasounds or other Referrals: n/a Maternal Substance Abuse: no Significant Maternal Medications: none Significant Maternal Lab Results: no  Results for orders placed or performed during the hospital encounter of 05/30/22 (from the past 24 hour(s))  Fern Test   Collection Time: 05/30/22 10:50 AM  Result Value Ref Range   POCT Fern Test Positive = ruptured amniotic membanes     Patient Active Problem List   Diagnosis Date Noted   Supervision of high risk pregnancy, antepartum 08/29/2020   Type 2 diabetes mellitus ([place]) 08/29/2020   Supervision of other normal pregnancy, antepartum 07/03/2020   Limited prenatal care 04/08/2019   SVD (spontaneous vaginal delivery) 04/08/2019   GBS (group B Streptococcus carrier), +RV culture, currently pregnant 04/06/2019   Alpha thalassemia silent carrier 02/06/2019   Anemia 01/19/2019   Unwanted fertility 01/19/2019   Supervision of low-risk pregnancy 01/16/2019   Morbid obesity ([place])    Assessment: [redacted] weeks gestation IUFD Breech presentation  Plan: Admit to LD Cytotec Epidural when pt requests Anticipate VD- Attending MD @bs$  during delivery    Julianne Handler, CNM  05/30/2022, 11:13 AM

## 2022-05-30 NOTE — Discharge Summary (Signed)
Postpartum Discharge Summary  Date of Service updated***     Patient Name: Linda Torres DOB: Dec 05, 1995 MRN: [id number]  Date of admission: 05/30/2022 Delivery date:05/30/2022  Delivering provider: Gaylan Gerold R  Date of discharge: 05/30/2022  Admitting diagnosis: IUFD at 32 weeks or more of gestation [O36.4XX0] Intrauterine pregnancy: [redacted]w[redacted]d    Secondary diagnosis:  Principal Problem:   IUFD at 265 weeksor more of gestation  Additional problems:   Discharge diagnosis: Term Pregnancy Delivered, CHTN, Type 2 DM, and IUFD                                               Postpartum procedures:*** Augmentation: Cytotec [facility] of Labor With Vaginal Delivery      27y.o. yo GAQ:[id number] 393w6das admitted in Active Labor on 05/30/2022.Uncomplicated labor course - requested epidural, refused by anesthesia due to recent oral intake. Pt delivered prior to epidural placement. Membrane Rupture Time/Date: 10:00 AM ,05/30/2022   Delivery Method:Vaginal, Spontaneous  Episiotomy: None  Lacerations:  None  Patient had a postpartum course complicated by ***.  She is ambulating, tolerating a regular diet, passing flatus, and urinating well. Patient is discharged home in stable condition on 05/30/22.  Newborn Data: Birth date:05/30/2022  Birth time:8:42 PM  Gender:Female  Living status:Fetal Demise  Apgars:0 ,0  Weight:9 lb 6 oz (4.252 kg)   Magnesium Sulfate received: No BMZ received: No Rhophylac:N/A MMR:N/A T-DaP: Declined Flu: No Transfusion:No  Physical exam  Vitals:   05/30/22 2115 05/30/22 2130 05/30/22 2145 05/30/22 2200  BP: (!) 145/74 (!) 141/108 (!) 156/97 (!) 146/74  Pulse: (!) 108 (!) 113 (!) 107 (!) 128  Resp: 17     Temp: 97.8 F (36.6 C)     TempSrc: Oral     SpO2:      Weight:      Height:       General: {Exam; general:21111117} Lochia: {Desc; appropriate/inappropriate:30686::"appropriate"} Uterine Fundus: {Desc;  firm/soft:30687} Incision: {Exam; incision:21111123} DVT Evaluation: {Exam; dvYS:[id number]: Lab Results  Component Value Date   WBC 7.5 05/30/2022   HGB 11.2 (L) 05/30/2022   HCT 34.5 (L) 05/30/2022   MCV 68.0 (L) 05/30/2022   PLT 209 05/30/2022      Latest Ref Rng & Units 05/30/2022   12:29 PM  CMP  Glucose 70 - 99 mg/dL 182   BUN 6 - 20 mg/dL 5   Creatinine 0.44 - 1.00 mg/dL 0.53   Sodium 135 - 145 mmol/L 131   Potassium 3.5 - 5.1 mmol/L 3.8   Chloride 98 - 111 mmol/L 101   CO2 22 - 32 mmol/L 18   Calcium 8.9 - 10.3 mg/dL 9.1   Total Protein 6.5 - 8.1 g/dL 6.4   Total Bilirubin 0.3 - 1.2 mg/dL 0.3   Alkaline Phos 38 - 126 U/L 65   AST 15 - 41 U/L 21   ALT 0 - 44 U/L 12    Edinburgh Score:     No data to display         After visit meds:  Allergies as of 05/30/2022   No Known Allergies   Med Rec must be completed [facility] to [facility]     Discharge home in stable condition Infant Feeding: {Baby feeding:23562} Infant Disposition:{CHL IP OB HOME WITH MODX:[id number] instruction: per After Visit Summary  and Postpartum booklet. Activity: Advance as tolerated. Pelvic rest for 6 weeks.  Diet: {OB BY:630183 Future Appointments:No future appointments.  Follow up Visit: Message sent to [facility] for scheduling   Please schedule this patient for a In person postpartum visit in 4 weeks with the following provider:  Gaylan Gerold, CNM . Additional Postpartum F/U:Postpartum Depression checkup and BP check 1 week  High risk pregnancy complicated by: HTN and DM2, no PNC, IUFD Delivery mode:  Vaginal, Spontaneous  Anticipated Birth Control:   Declined   05/30/2022 Gabriel Carina, CNM

## 2022-05-30 NOTE — MAU Provider Note (Signed)
History     CSN: [id number]  Arrival date and time: 05/30/22 1022   None     Chief Complaint  Patient presents with   Contractions   HPI  Linda Torres is a 27 y.o. female [id number] @ 52w6dby certain LMP; no prenatal care throughout this pregnancy. She is here with reports of ROM that occurred this morning around 0600. The fluid is yellow/green. She reports some lower abdominal pain/contractions. She has a history of Type 2 diabetes diagnosed in 2020 with an A1c of 7.0. She has not been checking her BS or managing her diabetes since diagnoses. She had one telehealth visit with PCP in Nov 2023 reports elevated BP (170/100), She denies any other episodes of HTN.    She has no HA, no changes in her vision Last fetal movement was last night.    OB History     Gravida  5   Para  4   Term  4   Preterm      AB      Living  4      SAB      IAB      Ectopic      Multiple  0   Live Births  4           Past Medical History:  Diagnosis Date   Anemia    Diabetes mellitus without complication ([place]    Morbid obesity ([place]     Past Surgical History:  Procedure Laterality Date   NO PAST SURGERIES      Family History  Problem Relation Age of Onset   Diabetes Mother    Depression Mother    Thyroid disease Mother    Depression Maternal Grandmother    Diabetes Maternal Grandmother    Thyroid disease Maternal Grandmother     Social History   Tobacco Use   Smoking status: Never   Smokeless tobacco: Never  Vaping Use   Vaping Use: Never used  Substance Use Topics   Alcohol use: Never   Drug use: Never    Allergies: No Known Allergies  Medications Prior to Admission  Medication Sig Dispense Refill Last Dose   Accu-Chek Softclix Lancets lancets Use as instructed 100 each 12    acetaminophen (TYLENOL) 500 MG tablet Take 2 tablets (1,000 mg total) by mouth every 6 (six) hours as needed.      ascorbic acid (VITAMIN C) 250 MG tablet Take 1 tablet  (250 mg total) by mouth every other day.      Blood Glucose Monitoring Suppl (ACCU-CHEK GUIDE) w/Device KIT 1 each by Does not apply route 4 (four) times daily. 1 kit 0    coconut oil OIL Apply 1 application topically as needed.  0    ferrous sulfate 325 (65 FE) MG tablet Take 1 tablet (325 mg total) by mouth every other day.  3    ibuprofen (ADVIL) 600 MG tablet Take 1 tablet (600 mg total) by mouth every 6 (six) hours as needed. 30 tablet 0    metFORMIN (GLUCOPHAGE) 500 MG tablet Take 2 tablets (1,000 mg total) by mouth 2 (two) times daily with a meal. 60 tablet 3    Prenatal Vit-Fe Fumarate-FA (PRENATAL MULTIVITAMIN) TABS tablet Take 1 tablet by mouth daily at 12 noon.      Results for orders placed or performed during the hospital encounter of 05/30/22 (from the past 48 hour(s))  Fern Test     Status: None   Collection Time:  05/30/22 10:50 AM  Result Value Ref Range   POCT Fern Test Positive = ruptured amniotic membanes   Glucose, capillary     Status: Abnormal   Collection Time: 05/30/22 11:39 AM  Result Value Ref Range   Glucose-Capillary 180 (H) 70 - 99 mg/dL    Comment: Glucose reference range applies only to samples taken after fasting for at least 8 hours.     Review of Systems  Constitutional:  Negative for fever.  Gastrointestinal:  Positive for abdominal pain.   Physical Exam   Blood pressure 133/83, pulse (!) 137, temperature 97.9 F (36.6 C), temperature source Oral, resp. rate 20, height 5' 3"$  (1.6 m), weight (!) 148.2 kg, SpO2 99 %, unknown if currently breastfeeding.  Patient Vitals for the past 24 hrs:  BP Temp Temp src Pulse Resp SpO2 Height Weight  05/30/22 1127 133/83 -- -- (!) 137 -- -- -- --  05/30/22 1045 (!) 141/81 97.9 F (36.6 C) Oral (!) 122 20 99 % 5' 3"$  (1.6 m) (!) 148.2 kg     Physical Exam Constitutional:      General: She is not in acute distress.    Appearance: Normal appearance. She is obese.  Skin:    General: Skin is warm.     Capillary  Refill: Capillary refill takes more than 3 seconds.  Neurological:     Mental Status: She is alert and oriented to person, place, and time.    MAU Course  Procedures  MDM RN unable to doppler fetal heart tones.  MAU provider called to room with requests for bedside US US performed, No fetal heart tones identified. Stat MFM [place] ordered for bedside Fundal height 45 cm.   Assessment and Plan   A:  1. Fetal demise, greater than 22 weeks, antepartum, single or unspecified fetus   2. [redacted] weeks gestation of pregnancy      P:  Dr. Roselie Awkward at bedside Confirmed breech presentation. Report given to M. Bhambri CNM who will resume care of the patient on Labor.   Lezlie Lye, NP 05/30/2022 12:43 PM

## 2022-05-30 NOTE — MAU Note (Signed)
Linda Torres is a 27 y.o. at 19w6dhere in MAU reporting: gush of clear fluid at 1000, continues to leak, large wet area on chair. No bleeding. No pain at this time. Reports +FM.  Onset of complaint: 1000 Pain score: none Vitals:   05/30/22 1045  BP: (!) 141/81  Pulse: (!) 122  Resp: 20  Temp: 97.9 F (36.6 C)  SpO2: 99%     [id number], reports +FM Lab orders placed from triage:    No prenatal care.

## 2022-05-30 NOTE — Progress Notes (Signed)
   05/30/22 2229  Spiritual Encounters  Type of Visit Initial  Care provided to: Patient;Significant other  Referral source Nurse (RN/NT/LPN)  Reason for visit Grief/loss  OnCall Visit Yes  Interventions  Spiritual Care Interventions Made Compassionate presence;Reflective listening  Intervention Outcomes  Outcomes Other (comment) (able to express emotions)   Chaplain was called to patient room after a fetal demise. Chaplain provided compassionate presence and reflective listening. Chaplain informed patient to let a nurse know if she needed any further support.   Note prepared by Abbott Pao, [place] Resident [phone]

## 2022-05-31 ENCOUNTER — Encounter (HOSPITAL_COMMUNITY): Payer: Self-pay | Admitting: Obstetrics & Gynecology

## 2022-05-31 LAB — RPR: RPR Ser Ql: NONREACTIVE

## 2022-05-31 LAB — GLUCOSE, CAPILLARY: Glucose-Capillary: 190 mg/dL — ABNORMAL HIGH (ref 70–99)

## 2022-05-31 MED ORDER — METFORMIN HCL 500 MG PO TABS: 1000.0000 mg | ORAL_TABLET | Freq: Two times a day (BID) | ORAL | 3 refills | Status: DC

## 2022-05-31 MED ORDER — INSULIN ASPART 100 UNIT/ML IJ SOLN: 0.0000 [IU] | Freq: Three times a day (TID) | INTRAMUSCULAR | Status: DC

## 2022-05-31 MED ORDER — NIFEDIPINE ER OSMOTIC RELEASE 30 MG PO TB24
30.0000 mg | ORAL_TABLET | Freq: Every day | ORAL | Status: DC
  Administered 2022-05-31: 30 mg via ORAL
  Filled 2022-05-31 (×2): qty 1

## 2022-05-31 MED ORDER — NIFEDIPINE ER 30 MG PO TB24: 30.0000 mg | ORAL_TABLET | Freq: Every day | ORAL | 1 refills | Status: DC

## 2022-05-31 MED ORDER — OXYCODONE HCL 5 MG PO TABS
5.0000 mg | ORAL_TABLET | ORAL | Status: DC | PRN
  Filled 2022-05-31: qty 1

## 2022-05-31 MED ORDER — ACETAMINOPHEN 325 MG PO TABS: 650.0000 mg | ORAL_TABLET | ORAL | Status: DC | PRN

## 2022-05-31 MED ORDER — NIFEDIPINE ER OSMOTIC RELEASE 30 MG PO TB24: 30.0000 mg | ORAL_TABLET | Freq: Two times a day (BID) | ORAL | Status: DC

## 2022-05-31 MED ORDER — FUROSEMIDE 20 MG PO TABS: 20.0000 mg | ORAL_TABLET | Freq: Every day | ORAL | 0 refills | Status: DC

## 2022-05-31 MED ORDER — IBUPROFEN 600 MG PO TABS: 600.0000 mg | ORAL_TABLET | Freq: Four times a day (QID) | ORAL | 0 refills | Status: DC

## 2022-05-31 MED ORDER — METFORMIN HCL 500 MG PO TABS: 500.0000 mg | ORAL_TABLET | Freq: Two times a day (BID) | ORAL | Status: DC

## 2022-05-31 MED ORDER — METHYLERGONOVINE MALEATE 0.2 MG PO TABS
0.2000 mg | ORAL_TABLET | ORAL | Status: DC
  Administered 2022-05-31 (×2): 0.2 mg via ORAL
  Filled 2022-05-31 (×2): qty 1

## 2022-05-31 MED ORDER — IBUPROFEN 600 MG PO TABS
600.0000 mg | ORAL_TABLET | Freq: Four times a day (QID) | ORAL | Status: DC
  Administered 2022-05-31 (×2): 600 mg via ORAL
  Filled 2022-05-31 (×2): qty 1

## 2022-05-31 MED ORDER — SIMETHICONE 80 MG PO CHEW: 80.0000 mg | CHEWABLE_TABLET | ORAL | Status: DC | PRN

## 2022-05-31 MED ORDER — ONDANSETRON HCL 4 MG/2ML IJ SOLN: 4.0000 mg | INTRAMUSCULAR | Status: DC | PRN

## 2022-05-31 MED ORDER — OXYCODONE HCL 5 MG PO TABS: 10.0000 mg | ORAL_TABLET | ORAL | Status: DC | PRN

## 2022-05-31 MED ORDER — COCONUT OIL OIL: 1.0000 | TOPICAL_OIL | Status: DC | PRN

## 2022-05-31 MED ORDER — SENNOSIDES-DOCUSATE SODIUM 8.6-50 MG PO TABS: 2.0000 | ORAL_TABLET | Freq: Every day | ORAL | Status: DC

## 2022-05-31 MED ORDER — ZOLPIDEM TARTRATE 5 MG PO TABS: 5.0000 mg | ORAL_TABLET | Freq: Every evening | ORAL | 0 refills | Status: DC | PRN

## 2022-05-31 MED ORDER — DIBUCAINE (PERIANAL) 1 % EX OINT: 1.0000 | TOPICAL_OINTMENT | CUTANEOUS | Status: DC | PRN

## 2022-05-31 MED ORDER — FUROSEMIDE 20 MG PO TABS
20.0000 mg | ORAL_TABLET | Freq: Every day | ORAL | Status: DC
  Administered 2022-05-31: 20 mg via ORAL
  Filled 2022-05-31: qty 1

## 2022-05-31 MED ORDER — WITCH HAZEL-GLYCERIN EX PADS: 1.0000 | MEDICATED_PAD | CUTANEOUS | Status: DC | PRN

## 2022-05-31 MED ORDER — BENZOCAINE-MENTHOL 20-0.5 % EX AERO
1.0000 | INHALATION_SPRAY | CUTANEOUS | Status: DC | PRN
  Filled 2022-05-31: qty 56

## 2022-05-31 MED ORDER — DIPHENHYDRAMINE HCL 25 MG PO CAPS: 25.0000 mg | ORAL_CAPSULE | Freq: Four times a day (QID) | ORAL | Status: DC | PRN

## 2022-05-31 MED ORDER — ONDANSETRON HCL 4 MG PO TABS: 4.0000 mg | ORAL_TABLET | ORAL | Status: DC | PRN

## 2022-05-31 MED ORDER — PRENATAL MULTIVITAMIN CH: 1.0000 | ORAL_TABLET | Freq: Every day | ORAL | Status: DC

## 2022-05-31 NOTE — Plan of Care (Signed)
  Problem: Education: Goal: Knowledge of Childbirth will improve Outcome: Adequate for Discharge Goal: Ability to make informed decisions regarding treatment and plan of care will improve Outcome: Adequate for Discharge Goal: Ability to state and carry out methods to decrease the pain will improve Outcome: Adequate for Discharge Goal: Individualized Educational Video(s) Outcome: Not Applicable   Problem: Coping: Goal: Ability to verbalize concerns and feelings about labor and delivery will improve Outcome: Adequate for Discharge   Problem: Life Cycle: Goal: Ability to make normal progression through stages of labor will improve Outcome: Completed/Met Goal: Ability to effectively push during vaginal delivery will improve Outcome: Completed/Met   Problem: Role Relationship: Goal: Will demonstrate positive interactions with the child Outcome: Adequate for Discharge   Problem: Safety: Goal: Risk of complications during labor and delivery will decrease Outcome: Adequate for Discharge   Problem: Pain Management: Goal: Relief or control of pain from uterine contractions will improve Outcome: Adequate for Discharge   

## 2022-05-31 NOTE — Progress Notes (Signed)
Dilaudid not in pysix. 29.47m of dilaudid wasted with CClementeen Hoof rn

## 2022-06-01 LAB — HSV 1 ANTIBODY, IGG: HSV 1 Glycoprotein G Ab, IgG: <0.91 index (ref 0.00–0.90)

## 2022-06-01 LAB — HSV 2 ANTIBODY, IGG: HSV 2 Glycoprotein G Ab, IgG: <0.91 index (ref 0.00–0.90)

## 2022-06-01 LAB — RUBELLA SCREEN: Rubella: 4.22 index (ref >0.99)

## 2022-06-01 LAB — CMV ANTIBODY, IGG (EIA): CMV Ab - IgG: <0.6 U/mL (ref 0.00–0.59)

## 2022-06-01 LAB — TOXOPLASMA GONDII ANTIBODY, IGG: Toxoplasma IgG Ratio: <3 IU/mL (ref 0.0–7.1)

## 2022-06-02 LAB — SURGICAL PATHOLOGY

## 2022-06-04 ENCOUNTER — Telehealth: Payer: Self-pay | Admitting: Family Medicine

## 2022-06-04 NOTE — Telephone Encounter (Signed)
Patient called to cancel her appointment for this coming Monday for her Blood Pressure check, I asked if she want to reschedule and she said "NO"

## 2022-06-08 ENCOUNTER — Ambulatory Visit: Payer: Self-pay

## 2022-06-10 ENCOUNTER — Telehealth (HOSPITAL_COMMUNITY): Payer: Self-pay | Admitting: *Deleted

## 2022-06-10 NOTE — Telephone Encounter (Signed)
Attempted hospital discharge follow-up call. Left message for patient to return RN call with any questions or concerns. Erline Levine, RN, 06/10/22, [phone]

## 2022-06-18 ENCOUNTER — Encounter: Payer: Self-pay | Admitting: General Practice

## 2022-06-23 ENCOUNTER — Telehealth: Payer: Self-pay | Admitting: Lactation Services

## 2022-06-23 ENCOUNTER — Telehealth: Payer: Self-pay | Admitting: Family Medicine

## 2022-06-23 NOTE — Telephone Encounter (Signed)
Calling back for Anora results

## 2022-06-23 NOTE — Telephone Encounter (Signed)
Called patient to inform her that Anora testing showed Inconclusive results due to insufficient DNA. Patient did not answer. LM for patient to call the office at [phone] for non urgent results.

## 2022-06-24 NOTE — Telephone Encounter (Signed)
Called and informed patient of Anora Testing showing in conclusive results and that second dissection is being run and should receive result soon. Reviewed that second testing may or may not yield results. Patient voiced understanding. Informed we will call her with any follow up results.

## 2022-06-24 NOTE — Telephone Encounter (Signed)
Patient returned call yesterday afternoon.   Return call to patient, she did not answer. LM for her to call the office at [phone] for non urgent results.

## 2022-07-01 ENCOUNTER — Ambulatory Visit: Payer: Self-pay | Admitting: Certified Nurse Midwife

## 2022-07-22 ENCOUNTER — Encounter: Payer: Self-pay | Admitting: Certified Nurse Midwife

## 2022-07-22 ENCOUNTER — Telehealth: Payer: Medicaid Other | Admitting: Certified Nurse Midwife

## 2022-07-22 DIAGNOSIS — Z8759 Personal history of other complications of pregnancy, childbirth and the puerperium: Secondary | ICD-10-CM

## 2022-07-22 NOTE — Progress Notes (Signed)
Attempted to return call twice, will try to contact patient again tomorrow. Gaylan Gerold, CNM, MSN, IBCLC

## 2022-07-22 NOTE — Telephone Encounter (Signed)
Called pt in response to MyChart message. Appt scheduled with Gilford Rile, CNM virtually for today.

## 2022-07-22 NOTE — Progress Notes (Signed)
I connected with  Bing Matter on 07/22/22 at  3:15 PM EDT by telephone and verified that I am speaking with the correct person using two identifiers.   I discussed the limitations, risks, security and privacy concerns of performing an evaluation and management service by telephone and the availability of in person appointments. I also discussed with the patient that there may be a patient responsible charge related to this service. The patient expressed understanding and agreed to proceed.  Annabell Howells, RN 07/22/2022  3:07 PM

## 2022-07-23 ENCOUNTER — Telehealth: Payer: Self-pay

## 2022-07-23 NOTE — Telephone Encounter (Signed)
Called pt to follow up on needs expressed via MyChart. Pt states she has been trying to reach the office by phone and has not been able to wait on hold to speak with someone. Discussed the following with patient:   Type 2 Diabetes: currently taking Metformin 1000 mg BID. Has trouble remembering to take this multiple times a day. Taking medications once a day works better for her schedule. Did have side effect of headache for first few weeks on Metformin, but does not have any side effects now. Has checked BG twice in the past 2 months. Is concerned that not taking medication as prescribed is not best for her health. Chronic hypertension: Nifedipine 30 mg daily. BP today while on phone is 131/100, HR 77. Denies any headache, vision change, or dizziness.  Sleep: Has not been getting adequate sleep. Describes 4-6 hours of sleep at night. Usually has trouble falling asleep, feels like she is keeping herself awake but not meaning to. Describes waking up for hours at a time at night and feeling completely awake. Anxiety: Describes avoiding leaving home or going outside. Does get out of bed and stay active inside. Having trouble maintaining daily tasks like taking medications, drinking water, etc. Feels she is having trouble with memory and expressing her thoughts clearly. Struggling with school, but still enrolled and actively trying to complete schoolwork. Open to trying a daily medication for anxiety. Her husband feels she needs something every day and she does better with a daily medication routine. I asked about previous therapy, reports only attending initial visits with therapists and not returning. Has been made to feel she is alarming the therapist. Does not feel they offer solutions. Would prefer to "resolve on your own." Open to recommendation from [facility] but otherwise uninterested in behavioral health services.   Explained I will forward Jamilla our phone call with her concerns and my suggestions.  Will return for full visit on 07/29/22 at 4:15 PM.

## 2022-07-29 ENCOUNTER — Encounter: Payer: Self-pay | Admitting: Certified Nurse Midwife

## 2022-07-29 ENCOUNTER — Ambulatory Visit (INDEPENDENT_AMBULATORY_CARE_PROVIDER_SITE_OTHER): Payer: Medicaid Other | Admitting: Certified Nurse Midwife

## 2022-07-29 VITALS — BP 133/85 | HR 101 | Wt 313.0 lb

## 2022-07-29 DIAGNOSIS — E119 Type 2 diabetes mellitus without complications: Secondary | ICD-10-CM | POA: Diagnosis not present

## 2022-07-29 DIAGNOSIS — F902 Attention-deficit hyperactivity disorder, combined type: Secondary | ICD-10-CM | POA: Diagnosis not present

## 2022-07-29 DIAGNOSIS — F5102 Adjustment insomnia: Secondary | ICD-10-CM | POA: Diagnosis not present

## 2022-07-29 DIAGNOSIS — F411 Generalized anxiety disorder: Secondary | ICD-10-CM

## 2022-07-29 DIAGNOSIS — I10 Essential (primary) hypertension: Secondary | ICD-10-CM

## 2022-07-29 NOTE — Progress Notes (Unsigned)
Postpartum Visit Note  Linda Torres is a 27 y.o. [id number] female who presents for a postpartum visit. She is 8 weeks 4 days postpartum following a normal spontaneous vaginal delivery.  I have fully reviewed the prenatal and intrapartum course. The delivery was at 39 gestational weeks.  Anesthesia: none. Postpartum course has been complicated by grief.  Bleeding no bleeding. Bowel function is normal. Bladder function is normal. Patient is sexually active. Contraception method is rhythm method. Postpartum depression screening: positive.  Health Maintenance Due  Topic Date Due   COVID-19 Vaccine (1) Never done   FOOT EXAM  Never done   OPHTHALMOLOGY EXAM  Never done   PAP-Cervical Cytology Screening  Never done   PAP SMEAR-Modifier  Never done    The following portions of the patient's history were reviewed and updated as appropriate: allergies, current medications, past family history, past medical history, past social history, past surgical history, and problem list.  Review of Systems Pertinent items noted in HPI and remainder of comprehensive ROS otherwise negative.  Objective:  BP 133/85   Pulse (!) 101   Wt (!) 313 lb (142 kg)   Breastfeeding No   BMI 55.45 kg/m    General:  alert, cooperative, appears stated age, and no distress   Breasts:  normal  Lungs: Normal effort  Heart:  regular rate and rhythm  Abdomen: Non-tender    Wound N/A  GU exam:  not indicated       Assessment:  Postpartum care and examination - Doing as well as can be expected, declined to complete depression screening but has no suicidal intent. - Experienced difficulty with the funeral home and cemetery recommended to her as being helpful to those with financial difficulties.   Grief with anxiety and increased symptoms r/t her underlying ADHD - Has been previously diagnosed with ADHD and believes herself to be on the autism spectrum (has a daughter with diagnosed autism)  - Notes increasing  hyperactive behaviors including racing/spiraling thoughts - Unable to leave the house easily without great anxiety - Requests to try a medication, has tried therapy before but it has not been helpful. Cautiously amenable to attempting again with our [facility] specialist. Referral placed. - Suggested low-dose Strattera, can increase dose as needed.   Adjustment insomnia - Was able to sleep restfully with Remus Loffler (husband affirms this is the first time he's seen her sleep well since prior to this pregnancy) - Ambien refilled, affirmed that sleep is very important to protect as she processes her grief and gets on top of her health  Type 2 DM (last A1C=9.6, glucose in the hospital = 180-243) - Would like to try extended release form of metformin as she often forgets to take it twice a day. - Long discussion on importance of NOT being overly restrictive with eating - strongly encouraged to eat within an hour of rising and then q3hrs throughout the day. Pt agreed to try. - 850mg  metformin XL prescribed - Is motivated to seek care for herself but as she has deep distrust of the medical system due to previous experiences, is currently only ok coming to regular CNM visits. Will work to [employer] and find other specialists she is comfortable with to assist with care.  Primary hypertension - refilled daily 30mg  procardia XL (BP normal today, working well for her)  Plan:   Essential components of care per ACOG recommendations:  1.  Mood and well being: Patient with declined depression screening today. Reviewed local  resources for support.  - Patient tobacco use? No.   - hx of drug use? No.    2. Sexuality, contraception and birth spacing - Patient does not want a pregnancy in the next year.  Desired family size is 4 children.  - Reviewed reproductive life planning. Reviewed contraceptive methods based on pt preferences and effectiveness.  Patient desired Abstinence and FAM or LAM today.   - Discussed  birth spacing of 18 months  4. Sleep and fatigue -Encouraged family/partner/community support of 4 hrs of uninterrupted sleep to help with mood and fatigue  5. Physical Recovery  - Discussed patients delivery and complications. She describes her labor as bad. - Patient had a  unmedicated vaginal breech delivery of a stillborn daughter. It was incredibly traumatic for her . Patient had  no  laceration. Perineal healing reviewed. Patient expressed understanding - Patient has urinary incontinence? No. - Patient is safe to resume physical and sexual activity  6.  Health Maintenance - HM due items addressed Yes - Last pap smear unknown but will work to get it updated at future visits -Breast Cancer screening indicated? No.   7. Chronic Disease/Pregnancy Condition follow up: Hypertension and DM2 - PCP follow up as patient is willing  Bernerd Limbo, CNM Center for [employer], [facility]

## 2022-07-30 MED ORDER — ATOMOXETINE HCL 10 MG PO CAPS: 10.0000 mg | ORAL_CAPSULE | Freq: Every day | ORAL | 6 refills | Status: AC

## 2022-07-30 MED ORDER — ZOLPIDEM TARTRATE 10 MG PO TABS: 10.0000 mg | ORAL_TABLET | Freq: Every evening | ORAL | 3 refills | Status: AC | PRN

## 2022-07-30 MED ORDER — METFORMIN HCL ER 750 MG PO TB24: 750.0000 mg | ORAL_TABLET | Freq: Every day | ORAL | 3 refills | Status: DC

## 2022-07-30 MED ORDER — NIFEDIPINE ER 30 MG PO TB24: 30.0000 mg | ORAL_TABLET | Freq: Every day | ORAL | 3 refills | Status: AC

## 2022-08-17 ENCOUNTER — Telehealth: Payer: Self-pay | Admitting: General Practice

## 2022-08-17 NOTE — Telephone Encounter (Signed)
Attempted to contact patient to schedule apt with PCP. No answer. Unable to leave VM due to VM box not being set up. AS, CMA

## 2022-10-13 NOTE — BH Specialist Note (Unsigned)
Integrated Behavioral Health via Telemedicine Visit  10/15/2022 Linda Torres [id number]  Number of Integrated Behavioral Health Clinician visits: 1- Initial Visit  Session Start time: 0817   Session End time: 0922  Total time in minutes: 65   Referring Provider: Edd Arbour, CNM Patient/Family location: [facility] Provider location: Center for [facility] at [facility]  All persons participating in visit: Patient Linda Torres and [facility] Darryel Diodato   Types of Service: Individual psychotherapy and Video visit  I connected with Andres Labrum and/or Barnie Del Cobbins's  n/a  via  Telephone or Video Enabled Telemedicine Application  (Video is Caregility application) and verified that I am speaking with the correct person using two identifiers. Discussed confidentiality: Yes   I discussed the limitations of telemedicine and the availability of in person appointments.  Discussed there is a possibility of technology failure and discussed alternative modes of communication if that failure occurs.  I discussed that engaging in this telemedicine visit, they consent to the provision of behavioral healthcare and the services will be billed under their insurance.  Patient and/or legal guardian expressed understanding and consented to Telemedicine visit: Yes   Presenting Concerns: Patient and/or family reports the following symptoms/concerns: Processing struggle to confirm her paternal lineage, as well as growing awareness of her neurodivergent life experience; navigating through grieving the late gestational loss of her daughter and managing the needs of her children (6,4,3,2; all on the autism spectrum).  Duration of problem: Ongoing; Severity of problem:  moderately severe  Patient and/or Family's Strengths/Protective Factors: Concrete supports in place (healthy food, safe environments, etc.) and Sense of purpose  Goals Addressed: Patient will:  Reduce  symptoms of: anxiety, depression, and stress   Increase knowledge and/or ability of: stress reduction   Demonstrate ability to: Increase adequate support systems for patient/family and Increase motivation to adhere to plan of care  Progress towards Goals: Ongoing  Interventions: Interventions utilized:  Link to [employer] and Supportive Reflection Standardized Assessments completed: Not Needed  Patient and/or Family Response: Patient agrees with treatment plan.   Assessment: Patient currently experiencing Generalized anxiety disorder; ADHD.   Patient may benefit from psychoeducation and brief therapeutic interventions regarding coping with symptoms of anxiety and depression; would benefit from additional assessment.  .  Plan: Follow up with behavioral health clinician on : Two weeks Behavioral recommendations:  -Continue search for father; take self-care breaks as needed (maintaining sleeping/eating routine as priority) -Consider pregnancy loss support group at www.postpartum.net -Read through additional resources on After Visit Summary; consider taking self-administered quizzes on embrace-autism.com website; will discuss at follow up visit Referral(s): Integrated Art gallery manager (In Clinic) and [employer]:  Pregnancy loss support; adhd and autism support  I discussed the assessment and treatment plan with the patient and/or parent/guardian. They were provided an opportunity to ask questions and all were answered. They agreed with the plan and demonstrated an understanding of the instructions.   They were advised to call back or seek an in-person evaluation if the symptoms worsen or if the condition fails to improve as anticipated.  Rae Lips, LCSW     04/03/2019   11:35 AM 01/19/2019    9:55 AM 01/16/2019    1:42 PM  Depression screen PHQ 2/9  Decreased Interest 2 2 3   Down, Depressed, Hopeless 2 2 1   PHQ - 2 Score 4 4 4   Altered sleeping  1 1 1   Tired, decreased energy 3 3 2   Change in appetite 1  1 1  Feeling bad or failure about yourself  2 3 2   Trouble concentrating 3 1 1   Moving slowly or fidgety/restless 0 0 0  Suicidal thoughts 1 2 1   PHQ-9 Score 15 15 12   Difficult doing work/chores Not difficult at all        04/03/2019   11:38 AM 01/19/2019    9:56 AM 01/16/2019    1:40 PM  GAD 7 : Generalized Anxiety Score  Nervous, Anxious, on Edge 2 2 3   Control/stop worrying 2 1 0  Worry too much - different things 2 1 0  Trouble relaxing 3 2 1   Restless 1 0 0  Easily annoyed or irritable 2 3 1   Afraid - awful might happen 2 3 2   Total GAD 7 Score 14 12 7

## 2022-10-15 ENCOUNTER — Ambulatory Visit (INDEPENDENT_AMBULATORY_CARE_PROVIDER_SITE_OTHER): Payer: Medicaid Other | Admitting: Clinical

## 2022-10-15 DIAGNOSIS — F411 Generalized anxiety disorder: Secondary | ICD-10-CM

## 2022-10-15 DIAGNOSIS — F4321 Adjustment disorder with depressed mood: Secondary | ICD-10-CM | POA: Diagnosis not present

## 2022-10-15 DIAGNOSIS — F902 Attention-deficit hyperactivity disorder, combined type: Secondary | ICD-10-CM | POA: Diagnosis not present

## 2022-10-15 NOTE — Patient Instructions (Addendum)
Center for [facility] Healthcare at [facility] [address] [place], [place] [address] [phone] (main office) [phone] (Yamil Dougher's office)  Pregnancy Loss Support Groups www.postpartum.net  Embrace Autism Embrace-autism.com                                                             ADHD/ Adults                                                        [facility]                                                      [email or web address]                                         [address], 3rd floor                                                          [phone]                              No Med Management/ ADHD and Learning Evaluations                                   Medicaid, some insurance,  sliding scale available                                        [facility], www[email or web address]                                               [address]                                                         [phone]                                        Med Management, Psychotherapy  Medicaid, most insurance, sliding scale for uninsured         [facility], [email or web address]  [facility], [place], [address], [place]                       [phone] (non-medical management of ADHD)    [phone] (outpatient medical management of ADHD)                           Most insurance, financial assistance available                                                      Adult Support Group                                             [facility]                                       [address] [place], [place]                                                              [phone]     Or contact Mena Pauls at [phone] or [email or web address] for questions   [employer] Autism  Resources  [employer]: Autism Resources http://www1.SouvenirBaseball.es.htm  Partnership for Children [facility] https://www.[place]children.org/families/developmental-needs/  [employer] [facility] PreviewDomains.se  [employer] https://triadmomsonmain.com/my-blog/special-needs-resources/  Autism Society https://www.autismsociety-[place].org/find-help/

## 2022-10-19 NOTE — BH Specialist Note (Signed)
Integrated Behavioral Health via Telemedicine Visit  10/19/2022 Linda Torres [id number]  Number of Integrated Behavioral Health Clinician visits: 1- Initial Visit  Session Start time: 0817   Session End time: 0922  Total time in minutes: 65   Referring Provider: *** Patient/Family location: [facility] Provider location: *** All persons participating in visit: *** Types of Service: {CHL AMB TYPE OF [phone]}  I connected with Linda Torres and/or Linda Torres's {family members:20773} via  Telephone or Video Enabled Telemedicine Application  (Video is Caregility application) and verified that I am speaking with the correct person using two identifiers. Discussed confidentiality: {YES/NO:21197}  I discussed the limitations of telemedicine and the availability of in person appointments.  Discussed there is a possibility of technology failure and discussed alternative modes of communication if that failure occurs.  I discussed that engaging in this telemedicine visit, they consent to the provision of behavioral healthcare and the services will be billed under their insurance.  Patient and/or legal guardian expressed understanding and consented to Telemedicine visit: {YES/NO:21197}  Presenting Concerns: Patient and/or family reports the following symptoms/concerns: *** Duration of problem: ***; Severity of problem: {Mild/Moderate/Severe:20260}  Patient and/or Family's Strengths/Protective Factors: {CHL AMB BH PROTECTIVE [phone]}  Goals Addressed: Patient will:  Reduce symptoms of: {IBH Symptoms:21014056}   Increase knowledge and/or ability of: {IBH Patient Tools:21014057}   Demonstrate ability to: {IBH Goals:21014053}  Progress towards Goals: {CHL AMB BH PROGRESS TOWARDS GOALS:[phone]}  Interventions: Interventions utilized:  {IBH Interventions:21014054} Standardized Assessments completed: {IBH Screening Tools:21014051}  Patient and/or Family  Response: ***  Assessment: Patient currently experiencing ***.   Patient may benefit from ***.  Plan: Follow up with behavioral health clinician on : *** Behavioral recommendations: *** Referral(s): {IBH Referrals:21014055}  I discussed the assessment and treatment plan with the patient and/or parent/guardian. They were provided an opportunity to ask questions and all were answered. They agreed with the plan and demonstrated an understanding of the instructions.   They were advised to call back or seek an in-person evaluation if the symptoms worsen or if the condition fails to improve as anticipated.  Valetta Close Naveena Eyman, LCSW

## 2022-10-21 ENCOUNTER — Telehealth (INDEPENDENT_AMBULATORY_CARE_PROVIDER_SITE_OTHER): Payer: Medicaid Other | Admitting: Certified Nurse Midwife

## 2022-10-21 DIAGNOSIS — F5102 Adjustment insomnia: Secondary | ICD-10-CM

## 2022-10-21 DIAGNOSIS — F4321 Adjustment disorder with depressed mood: Secondary | ICD-10-CM

## 2022-10-21 DIAGNOSIS — F902 Attention-deficit hyperactivity disorder, combined type: Secondary | ICD-10-CM

## 2022-10-21 DIAGNOSIS — F411 Generalized anxiety disorder: Secondary | ICD-10-CM

## 2022-10-22 DIAGNOSIS — F5102 Adjustment insomnia: Secondary | ICD-10-CM | POA: Diagnosis not present

## 2022-10-22 DIAGNOSIS — F902 Attention-deficit hyperactivity disorder, combined type: Secondary | ICD-10-CM | POA: Diagnosis not present

## 2022-10-22 DIAGNOSIS — F4321 Adjustment disorder with depressed mood: Secondary | ICD-10-CM | POA: Diagnosis not present

## 2022-10-22 DIAGNOSIS — F411 Generalized anxiety disorder: Secondary | ICD-10-CM

## 2022-10-22 NOTE — Progress Notes (Signed)
GYNECOLOGY VIRTUAL VISIT ENCOUNTER NOTE  Provider location: [facility]   Patient location: Home  I connected with Linda Torres on 10/22/22 at  4:15 PM EDT by MyChart Video Encounter and verified that I am speaking with the correct person using two identifiers.   I discussed the limitations, risks, security and privacy concerns of performing an evaluation and management service virtually and the availability of in person appointments. I also discussed with the patient that there may be a patient responsible charge related to this service. The patient expressed understanding and agreed to proceed.   History:  Linda Torres is a 27 y.o. [id number] female being evaluated today for check-in after starting on anti-anxiety meds + Ambien. Reports that she sleeps well when she takes the Ambien, only taking it after particularly stressful days when she cannot fall asleep. Has not noticed much of a difference on the Strattera but her partner has commented on her seeming more balanced since starting it. Denies suicidal ideation, still having some flashbacks of her delivery and other traumatic childhood events, but is able to progress through school and do well at work/mothering. She denies any abnormal vaginal discharge, bleeding, pelvic pain or other concerns.       Past Medical History:  Diagnosis Date   Anemia    Diabetes mellitus without complication (HCC)    Morbid obesity (HCC)    Past Surgical History:  Procedure Laterality Date   NO PAST SURGERIES     The following portions of the patient's history were reviewed and updated as appropriate: allergies, current medications, past family history, past medical history, past social history, past surgical history and problem list.   Review of Systems:  Pertinent items noted in HPI and remainder of comprehensive ROS otherwise negative.  Physical Exam:   General:  Alert, oriented and cooperative.  Patient appears to be in no acute distress.  Mental Status: Appropriate mood and affect. Normal behavior. Normal judgment and thought content.   Respiratory: Normal respiratory effort, no problems with respiration noted  Rest of physical exam deferred due to type of encounter  Labs and Imaging No results found for this or any previous visit (from the past 336 hour(s)). No results found.     Assessment and Plan:  1. Adjustment insomnia - Sleeping well overall, uses Ambien as needed  2. ADHD (attention deficit hyperactivity disorder), combined type - Doing ok with Strattera, declined increase of dose to see if she would feel additional effects, thinks most of her symptoms are due to familial (maternal side) stress  3. Grief - Doing as well as can be expected, still processing what happened but progressing well through the grief process  4. Generalized anxiety disorder - Still describes social anxiety but is very much tied to autistic traits (despises small talk, being watched or questioned). Able to go and do things but does not find being out near people relaxing (making walks outside difficult since she lives in an apartment). - Much of her anxiety is tied to things she is finding out about her maternal family, she is processing deep family traumas.  - Her feelings are entirely appropriate for the number of things she is processing, offered regular appointments with our [facility] staff, she declined. Gave book list and referred for community based mentoring.  I discussed the assessment and treatment plan with the patient. The patient was provided an opportunity to ask questions and all were answered. The patient agreed with  the plan and demonstrated an understanding of the instructions.   The patient was advised to call back or seek an in-person evaluation/go to the ED if the symptoms worsen or if the condition fails to improve as anticipated.  I provided 60 minutes of face-to-face time during  this encounter.  Bernerd Limbo, CNM [facility] [employer]

## 2022-10-29 ENCOUNTER — Ambulatory Visit: Payer: Medicaid Other | Admitting: Clinical

## 2022-10-29 DIAGNOSIS — Z91199 Patient's noncompliance with other medical treatment and regimen due to unspecified reason: Secondary | ICD-10-CM

## 2022-12-22 ENCOUNTER — Other Ambulatory Visit: Payer: Self-pay | Admitting: Lactation Services

## 2022-12-22 DIAGNOSIS — E119 Type 2 diabetes mellitus without complications: Secondary | ICD-10-CM

## 2022-12-22 MED ORDER — METFORMIN HCL ER 750 MG PO TB24: 750.0000 mg | ORAL_TABLET | Freq: Every day | ORAL | 3 refills | Status: AC

## 2022-12-22 NOTE — Telephone Encounter (Signed)
Opened in error

## 2022-12-22 NOTE — Telephone Encounter (Signed)
Received RX refill request for Metformin. Refill request sent to Edd Arbour, CNM. Will send message to patient in regards to finding a PCP for follow up and further care.

## 2022-12-30 ENCOUNTER — Encounter: Payer: Self-pay | Admitting: Lactation Services
